# Patient Record
Sex: Male | Born: 1980 | Race: White | Hispanic: No | Marital: Married | Smoking: Never smoker
Health system: Southern US, Community
[De-identification: ages and names within clinical notes are randomized; demographics above are authoritative.]

## PROBLEM LIST (undated history)

## (undated) DIAGNOSIS — Z8489 Family history of other specified conditions: Secondary | ICD-10-CM

## (undated) DIAGNOSIS — N471 Phimosis: Secondary | ICD-10-CM

## (undated) DIAGNOSIS — J309 Allergic rhinitis, unspecified: Secondary | ICD-10-CM

## (undated) DIAGNOSIS — R1310 Dysphagia, unspecified: Secondary | ICD-10-CM

## (undated) DIAGNOSIS — Z8719 Personal history of other diseases of the digestive system: Secondary | ICD-10-CM

## (undated) DIAGNOSIS — K219 Gastro-esophageal reflux disease without esophagitis: Secondary | ICD-10-CM

## (undated) DIAGNOSIS — I10 Essential (primary) hypertension: Secondary | ICD-10-CM

## (undated) HISTORY — DX: Dysphagia, unspecified: R13.10

## (undated) HISTORY — DX: Allergic rhinitis, unspecified: J30.9

## (undated) HISTORY — DX: Gastro-esophageal reflux disease without esophagitis: K21.9

---
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
## 2002-07-26 ENCOUNTER — Emergency Department (HOSPITAL_COMMUNITY): Admission: EM | Admit: 2002-07-26 | Discharge: 2002-07-26 | Payer: Self-pay | Admitting: *Deleted

## 2003-08-06 ENCOUNTER — Ambulatory Visit (HOSPITAL_COMMUNITY): Admission: RE | Admit: 2003-08-06 | Discharge: 2003-08-06 | Payer: Self-pay | Admitting: Family Medicine

## 2003-08-16 ENCOUNTER — Ambulatory Visit (HOSPITAL_COMMUNITY): Admission: RE | Admit: 2003-08-16 | Discharge: 2003-08-16 | Payer: Self-pay | Admitting: Family Medicine

## 2004-09-03 ENCOUNTER — Emergency Department (HOSPITAL_COMMUNITY): Admission: EM | Admit: 2004-09-03 | Discharge: 2004-09-03 | Payer: Self-pay | Admitting: Emergency Medicine

## 2010-05-16 ENCOUNTER — Other Ambulatory Visit (HOSPITAL_COMMUNITY): Payer: Self-pay | Admitting: Family Medicine

## 2010-05-16 DIAGNOSIS — R1012 Left upper quadrant pain: Secondary | ICD-10-CM

## 2010-05-18 ENCOUNTER — Ambulatory Visit (HOSPITAL_COMMUNITY)
Admission: RE | Admit: 2010-05-18 | Discharge: 2010-05-18 | Disposition: A | Discharge status: Home / Self Care | Payer: BC Managed Care – PPO | Source: Ambulatory Visit | Attending: Family Medicine | Admitting: Family Medicine

## 2010-05-18 DIAGNOSIS — R935 Abnormal findings on diagnostic imaging of other abdominal regions, including retroperitoneum: Secondary | ICD-10-CM | POA: Insufficient documentation

## 2010-05-18 DIAGNOSIS — R1012 Left upper quadrant pain: Secondary | ICD-10-CM | POA: Insufficient documentation

## 2010-05-23 ENCOUNTER — Other Ambulatory Visit (HOSPITAL_COMMUNITY): Payer: Self-pay | Admitting: Family Medicine

## 2010-05-23 DIAGNOSIS — Z09 Encounter for follow-up examination after completed treatment for conditions other than malignant neoplasm: Secondary | ICD-10-CM

## 2010-05-23 DIAGNOSIS — R935 Abnormal findings on diagnostic imaging of other abdominal regions, including retroperitoneum: Secondary | ICD-10-CM

## 2010-05-26 ENCOUNTER — Other Ambulatory Visit (HOSPITAL_COMMUNITY): Payer: BC Managed Care – PPO

## 2010-06-05 ENCOUNTER — Ambulatory Visit (HOSPITAL_COMMUNITY)
Admission: RE | Admit: 2010-06-05 | Discharge: 2010-06-05 | Disposition: A | Discharge status: Home / Self Care | Payer: BC Managed Care – PPO | Source: Ambulatory Visit | Attending: Family Medicine | Admitting: Family Medicine

## 2010-06-05 DIAGNOSIS — R935 Abnormal findings on diagnostic imaging of other abdominal regions, including retroperitoneum: Secondary | ICD-10-CM | POA: Insufficient documentation

## 2010-06-05 DIAGNOSIS — Z09 Encounter for follow-up examination after completed treatment for conditions other than malignant neoplasm: Secondary | ICD-10-CM

## 2010-06-05 DIAGNOSIS — R1012 Left upper quadrant pain: Secondary | ICD-10-CM | POA: Insufficient documentation

## 2010-06-05 DIAGNOSIS — K7689 Other specified diseases of liver: Secondary | ICD-10-CM | POA: Insufficient documentation

## 2010-06-05 LAB — CREATININE, SERUM
Creatinine, Ser: 0.89 mg/dL (ref 0.4–1.5)
GFR calc Af Amer: >60 mL/min (ref >60)

## 2010-06-05 MED ORDER — GADOBENATE DIMEGLUMINE 529 MG/ML IV SOLN: 20.0000 mL | Freq: Once | INTRAVENOUS | Status: AC | PRN

## 2012-01-10 ENCOUNTER — Ambulatory Visit (HOSPITAL_COMMUNITY)
Admission: RE | Admit: 2012-01-10 | Discharge: 2012-01-10 | Disposition: A | Discharge status: Home / Self Care | Payer: BC Managed Care – PPO | Source: Ambulatory Visit | Attending: Family Medicine | Admitting: Family Medicine

## 2012-01-10 ENCOUNTER — Other Ambulatory Visit (HOSPITAL_COMMUNITY): Payer: Self-pay | Admitting: Family Medicine

## 2012-01-10 DIAGNOSIS — M25512 Pain in left shoulder: Secondary | ICD-10-CM

## 2012-01-10 DIAGNOSIS — M25519 Pain in unspecified shoulder: Secondary | ICD-10-CM | POA: Insufficient documentation

## 2012-02-27 DIAGNOSIS — Z8719 Personal history of other diseases of the digestive system: Secondary | ICD-10-CM

## 2012-02-27 DIAGNOSIS — K219 Gastro-esophageal reflux disease without esophagitis: Secondary | ICD-10-CM

## 2012-02-27 HISTORY — DX: Personal history of other diseases of the digestive system: Z87.19

## 2012-02-27 HISTORY — DX: Gastro-esophageal reflux disease without esophagitis: K21.9

## 2012-05-11 ENCOUNTER — Encounter (HOSPITAL_COMMUNITY): Payer: Self-pay

## 2012-05-11 ENCOUNTER — Emergency Department (HOSPITAL_COMMUNITY)
Admission: EM | Admit: 2012-05-11 | Discharge: 2012-05-11 | Disposition: A | Discharge status: Home / Self Care | Payer: BC Managed Care – PPO | Attending: Emergency Medicine | Admitting: Emergency Medicine

## 2012-05-11 DIAGNOSIS — I10 Essential (primary) hypertension: Secondary | ICD-10-CM | POA: Insufficient documentation

## 2012-05-11 DIAGNOSIS — R109 Unspecified abdominal pain: Secondary | ICD-10-CM | POA: Insufficient documentation

## 2012-05-11 DIAGNOSIS — Z8719 Personal history of other diseases of the digestive system: Secondary | ICD-10-CM | POA: Insufficient documentation

## 2012-05-11 DIAGNOSIS — R112 Nausea with vomiting, unspecified: Secondary | ICD-10-CM | POA: Insufficient documentation

## 2012-05-11 DIAGNOSIS — R197 Diarrhea, unspecified: Secondary | ICD-10-CM | POA: Insufficient documentation

## 2012-05-11 LAB — CBC WITH DIFFERENTIAL/PLATELET
Basophils Absolute: 0 10*3/uL (ref 0.0–0.1)
Basophils Relative: 0 % (ref 0–1)
Eosinophils Absolute: 0 10*3/uL (ref 0.0–0.7)
Eosinophils Relative: 0 % (ref 0–5)
HCT: 45 % (ref 39.0–52.0)
Hemoglobin: 16.5 g/dL (ref 13.0–17.0)
Lymphocytes Relative: 3 % — ABNORMAL LOW (ref 12–46)
Lymphs Abs: 0.3 10*3/uL — ABNORMAL LOW (ref 0.7–4.0)
MCH: 31.3 pg (ref 26.0–34.0)
MCHC: 36.7 g/dL — ABNORMAL HIGH (ref 30.0–36.0)
MCV: 85.4 fL (ref 78.0–100.0)
Monocytes Absolute: 0.6 10*3/uL (ref 0.1–1.0)
Monocytes Relative: 7 % (ref 3–12)
Neutro Abs: 7.7 10*3/uL (ref 1.7–7.7)
Neutrophils Relative %: 90 % — ABNORMAL HIGH (ref 43–77)
Platelets: 168 10*3/uL (ref 150–400)
RBC: 5.27 MIL/uL (ref 4.22–5.81)
RDW: 13.2 % (ref 11.5–15.5)
WBC: 8.5 10*3/uL (ref 4.0–10.5)

## 2012-05-11 LAB — URINALYSIS, ROUTINE W REFLEX MICROSCOPIC
Bilirubin Urine: NEGATIVE
Glucose, UA: NEGATIVE mg/dL
Hgb urine dipstick: NEGATIVE
Ketones, ur: NEGATIVE mg/dL
Leukocytes, UA: NEGATIVE
Nitrite: NEGATIVE
Protein, ur: NEGATIVE mg/dL
Specific Gravity, Urine: 1.015 (ref 1.005–1.030)
Urobilinogen, UA: 0.2 mg/dL (ref 0.0–1.0)
pH: 6 (ref 5.0–8.0)

## 2012-05-11 LAB — COMPREHENSIVE METABOLIC PANEL
AST: 21 U/L (ref 0–37)
Albumin: 4.3 g/dL (ref 3.5–5.2)
BUN: 14 mg/dL (ref 6–23)
CO2: 27 mEq/L (ref 19–32)
Calcium: 9.1 mg/dL (ref 8.4–10.5)
Chloride: 101 mEq/L (ref 96–112)
Glucose, Bld: 136 mg/dL — ABNORMAL HIGH (ref 70–99)
Potassium: 3.8 mEq/L (ref 3.5–5.1)
Total Bilirubin: 0.9 mg/dL (ref 0.3–1.2)
Total Protein: 7.4 g/dL (ref 6.0–8.3)

## 2012-05-11 LAB — LIPASE, BLOOD: Lipase: 13 U/L (ref 11–59)

## 2012-05-11 MED ORDER — SODIUM CHLORIDE 0.9 % IV BOLUS (SEPSIS)
1000.0000 mL | Freq: Once | INTRAVENOUS | Status: AC
  Administered 2012-05-11: 1000 mL via INTRAVENOUS

## 2012-05-11 MED ORDER — ONDANSETRON HCL 4 MG/2ML IJ SOLN
4.0000 mg | Freq: Once | INTRAMUSCULAR | Status: AC
  Administered 2012-05-11: 4 mg via INTRAVENOUS
  Filled 2012-05-11: qty 2

## 2012-05-11 MED ORDER — SODIUM CHLORIDE 0.9 % IV SOLN
Freq: Once | INTRAVENOUS | Status: AC
  Administered 2012-05-11: 15:00:00 via INTRAVENOUS

## 2012-05-11 MED ORDER — ONDANSETRON HCL 4 MG/2ML IJ SOLN
INTRAMUSCULAR | Status: AC
  Administered 2012-05-11: 4 mg via INTRAVENOUS
  Filled 2012-05-11: qty 2

## 2012-05-11 MED ORDER — METOCLOPRAMIDE HCL 10 MG PO TABS: 10.0000 mg | ORAL_TABLET | Freq: Four times a day (QID) | ORAL | Status: DC | PRN

## 2012-05-11 MED ORDER — ONDANSETRON HCL 4 MG/2ML IJ SOLN
4.0000 mg | Freq: Once | INTRAMUSCULAR | Status: AC
  Administered 2012-05-11: 4 mg via INTRAVENOUS

## 2012-05-11 NOTE — ED Notes (Signed)
Pt began with episodes of diarrhea and vomiting last night that have not yet resolved. Not able to keep any food or fluids down.

## 2012-05-11 NOTE — ED Notes (Signed)
Pt with vomiting and diarrhea since last night, unable to keep anything down per pt.

## 2012-05-11 NOTE — ED Provider Notes (Signed)
History    This chart was scribed for Gerhard Munch, MD by Charolett Bumpers, ED Scribe. The patient was seen in room APA14/APA14. Patient's care was started at 1504.   CSN: [id number]  Arrival date & time 05/11/12  1258   First MD Initiated Contact with Patient 05/11/12 1504      Chief Complaint  Patient presents with  . Emesis  . Diarrhea    The history is provided by the patient. No language interpreter was used.   Zachary Cisneros is a 32 y.o. male who presents to the Emergency Department complaining of persistent, moderate episodes of vomiting with associated diarrhea since 11:30 pm last night. He reports he had some mild abdominal discomfort earlier in the night. He last vomited 2 hours ago and had an episode of diarrhea at noon today. He reports he may have had a sick contact with similar symptoms. He reports a h/o HTN and enlarged spleen. He states that his spleen has been tender after vomiting today. He denies any tobacco or alcohol use.   History reviewed. No pertinent past medical history.  History reviewed. No pertinent past surgical history.  No family history on file.  History  Substance Use Topics  . Smoking status: Never Smoker   . Smokeless tobacco: Not on file  . Alcohol Use: No      Review of Systems  Constitutional:       Per HPI, otherwise negative  HENT:       Per HPI, otherwise negative  Respiratory:       Per HPI, otherwise negative  Cardiovascular:       Per HPI, otherwise negative  Gastrointestinal: Positive for nausea, vomiting and diarrhea.  Endocrine:       Negative aside from HPI  Genitourinary:       Neg aside from HPI   Musculoskeletal:       Per HPI, otherwise negative  Skin: Negative.   Neurological: Negative for syncope.    Allergies  Codeine and Sulfa antibiotics  Home Medications  No current outpatient prescriptions on file.  BP 142/102  Temp(Src) 98 F (36.7 C)  Resp 18  Ht 5\' 8"  (1.727 m)  Wt 268 lb (121.564  kg)  BMI 40.76 kg/m2  SpO2 97%  Physical Exam  Nursing note and vitals reviewed. Constitutional: He is oriented to person, place, and time. He appears well-developed. No distress.  HENT:  Head: Normocephalic and atraumatic.  Eyes: Conjunctivae and EOM are normal.  Cardiovascular: Normal rate, regular rhythm and normal heart sounds.   Pulmonary/Chest: Effort normal. No stridor. No respiratory distress.  Abdominal: Soft. He exhibits no distension. There is no tenderness. There is no rebound and no guarding.  Musculoskeletal: He exhibits no edema.  Neurological: He is alert and oriented to person, place, and time.  Skin: Skin is warm and dry.  Psychiatric: He has a normal mood and affect.    ED Course  Procedures (including critical care time)  DIAGNOSTIC STUDIES: Oxygen Saturation is 97% on room air, adequate by my interpretation.    COORDINATION OF CARE:  15:15-Discussed planned course of treatment with the patient including IV fluids, nausea medication, blood work and UA, who is agreeable at this time.   15:30-Medication Orders: Sodium chloride 0.9% bolus 1,000 mL-once; Ondansetron (Zofran) injection 4 mg-once.   Labs Reviewed - No data to display No results found.   No diagnosis found.    MDM   I personally performed the services described in this  documentation, which was scribed in my presence. The recorded information has been reviewed and is accurate.   This generally well-appearing male presents with concern of ongoing nausea, vomiting, diarrhea.  Notably, the patient had no episodes of diarrhea for several hours prior to the valuation, and throughout his emergency department stay and no additional episodes of vomiting or diarrhea.  The patient remained in no distress throughout his emergency department stay.  Labs do not indicate significant lateral abnormalities, and absent any new evidence of distress, his presentation is most consistent with gastroenteritis.   Absent abdominal pain there is low suspicion for early appendicitis or cholecystitis.  The patient was discharged in stable condition with antiemetics, GI followup as needed.  Gerhard Munch, MD 05/11/12 [phone]

## 2012-09-29 ENCOUNTER — Encounter: Payer: Self-pay | Admitting: Internal Medicine

## 2012-10-24 ENCOUNTER — Encounter: Payer: Self-pay | Admitting: Internal Medicine

## 2012-10-30 ENCOUNTER — Encounter: Payer: Self-pay | Admitting: Internal Medicine

## 2012-10-30 ENCOUNTER — Ambulatory Visit (INDEPENDENT_AMBULATORY_CARE_PROVIDER_SITE_OTHER): Payer: BC Managed Care – PPO | Admitting: Internal Medicine

## 2012-10-30 VITALS — BP 120/62 | HR 60 | Ht 68.0 in | Wt 269.2 lb

## 2012-10-30 DIAGNOSIS — K219 Gastro-esophageal reflux disease without esophagitis: Secondary | ICD-10-CM

## 2012-10-30 DIAGNOSIS — R1310 Dysphagia, unspecified: Secondary | ICD-10-CM

## 2012-10-30 DIAGNOSIS — K7689 Other specified diseases of liver: Secondary | ICD-10-CM

## 2012-10-30 DIAGNOSIS — K760 Fatty (change of) liver, not elsewhere classified: Secondary | ICD-10-CM

## 2012-10-30 NOTE — Patient Instructions (Addendum)
You have been scheduled for an endoscopy with propofol. Please follow written instructions given to you at your visit today. If you use inhalers (even only as needed), please bring them with you on the day of your procedure. Your physician has requested that you go to www.startemmi.com and enter the access code given to you at your visit today. This web site gives a general overview about your procedure. However, you should still follow specific instructions given to you by our office regarding your preparation for the procedure.                                                We are excited to introduce MyChart, a new best-in-class service that provides you online access to important information in your electronic medical record. We want to make it easier for you to view your health information - all in one secure location - when and where you need it. We expect MyChart will enhance the quality of care and service we provide.  When you register for MyChart, you can:    View your test results.    Request appointments and receive appointment reminders via email.    Request medication renewals.    View your medical history, allergies, medications and immunizations.    Communicate with your physician's office through a password-protected site.    Conveniently print information such as your medication lists.  To find out if MyChart is right for you, please talk to a member of our clinical staff today. We will gladly answer your questions about this free health and wellness tool.  If you are age 33 or older and want a member of your family to have access to your record, you must provide written consent by completing a proxy form available at our office. Please speak to our clinical staff about guidelines regarding accounts for patients younger than age 18.  As you activate your MyChart account and need any technical assistance, please call the MyChart technical support line at [phone]  ([phone]) or email your question to [email or web address]. If you email your question(s), please include your name, a return phone number and the best time to reach you.  If you have non-urgent health-related questions, you can send a message to our office through MyChart at [email or web address]. If you have a medical emergency, call 911.  Thank you for using MyChart as your new health and wellness resource!   MyChart licensed from [employer],  1999-2010. Patents Pending.   

## 2012-10-30 NOTE — Progress Notes (Signed)
Patient ID: Zachary Cisneros, male   DOB: 02-18-1981, 32 y.o.   MRN: [id number] HPI: Zachary Cisneros is a 32 yo male with PMH of GERD and allergic rhinitis he was seen in consultation at the request of Dr. Phillips Odor to evaluate dysphagia and reflux. The patient is alone today. He reports somewhat long-standing heartburn and acid reflux symptoms. He's been able to identify that onions definitely trigger his reflux and he avoids them entirely. Over the last 6 or so weeks he has developed some mild dysphagia to solid foods. This is usually with foods such as meats or breads. Liquids seem to go down okay. He occasionally feels a pressure in his anterior neck just above the sternal notch. This is only with eating. Occasionally he does feel like he has to force solids down with liquids. There is no history of food impaction. He denies nausea or vomiting. He denies abdominal pain. Good appetite. No early satiety. Normal bowel movements without diarrhea, constipation, rectal bleeding or melena. He has used omeprazole in the past and has cycled on and off this medicine. He reports when he uses it he does so for approximately one to 2 weeks. He does occasionally indoors regurgitation of solid foods and this can happen in the morning when he has not eaten.  No family history of upper GI tract malignancy. His paternal grandmother had rectal cancer but in her 49s.  No prior history of endoscopy  Past Medical History  Diagnosis Date  . GERD (gastroesophageal reflux disease)   . Dysphagia   . Allergic rhinitis     History reviewed. No pertinent past surgical history.  Current Outpatient Prescriptions  Medication Sig Dispense Refill  . ascorbic acid (VITAMIN C) 250 MG CHEW Chew 250 mg by mouth daily.      . fluticasone (FLONASE) 50 MCG/ACT nasal spray Place 2 sprays into the nose daily.      . moexipril-hydrochlorothiazide (UNIRETIC) 15-25 MG per tablet Take 2 tablets by mouth daily.      Marland Kitchen omeprazole (PRILOSEC) 40 MG capsule  Take 40 mg by mouth daily.       No current facility-administered medications for this visit.    Allergies  Allergen Reactions  . Codeine   . Sulfa Antibiotics     Family History  Problem Relation Age of Onset  . Hypertension Mother   . Hypertension Father   . Lung disease Father     History  Substance Use Topics  . Smoking status: Never Smoker   . Smokeless tobacco: Never Used  . Alcohol Use: No    ROS: As per history of present illness, otherwise negative  BP 120/62  Pulse 60  Ht 5\' 8"  (1.727 m)  Wt 269 lb 4 oz (122.131 kg)  BMI 40.95 kg/m2 Constitutional: Well-developed and well-nourished. No distress. HEENT: Normocephalic and atraumatic. Oropharynx is clear and moist. No oropharyngeal exudate. Conjunctivae are normal.  No scleral icterus. Neck: Neck supple. Trachea midline. Cardiovascular: Normal rate, regular rhythm and intact distal pulses. No M/R/G Pulmonary/chest: Effort normal and breath sounds normal. No wheezing, rales or rhonchi. Abdominal: Soft, nontender, nondistended. Bowel sounds active throughout. There are no masses palpable. No hepatosplenomegaly. Extremities: no clubbing, cyanosis, or edema Lymphadenopathy: No cervical adenopathy noted. Neurological: Alert and oriented to person place and time. Skin: Skin is warm and dry. No rashes noted. Psychiatric: Normal mood and affect. Behavior is normal.  RELEVANT LABS AND IMAGING: CBC    Component Value Date/Time   WBC 8.5 05/11/2012 1611  RBC 5.27 05/11/2012 1611   HGB 16.5 05/11/2012 1611   HCT 45.0 05/11/2012 1611   PLT 168 05/11/2012 1611   MCV 85.4 05/11/2012 1611   MCH 31.3 05/11/2012 1611   MCHC 36.7* 05/11/2012 1611   RDW 13.2 05/11/2012 1611   LYMPHSABS 0.3* 05/11/2012 1611   MONOABS 0.6 05/11/2012 1611   EOSABS 0.0 05/11/2012 1611   BASOSABS 0.0 05/11/2012 1611    CMP     Component Value Date/Time   NA 140 05/11/2012 1611   K 3.8 05/11/2012 1611   CL 101 05/11/2012 1611   CO2 27 05/11/2012  1611   GLUCOSE 136* 05/11/2012 1611   BUN 14 05/11/2012 1611   CREATININE 0.90 05/11/2012 1611   CALCIUM 9.1 05/11/2012 1611   PROT 7.4 05/11/2012 1611   ALBUMIN 4.3 05/11/2012 1611   AST 21 05/11/2012 1611   ALT 39 05/11/2012 1611   ALKPHOS 71 05/11/2012 1611   BILITOT 0.9 05/11/2012 1611   GFRNONAA >90 05/11/2012 1611   GFRAA >90 05/11/2012 1611   MRI ABDOMEN WITH AND WITHOUT CONTRAST - 06/11/2012   Technique:  Multiplanar multisequence MR imaging of the abdomen was performed both before and after administration of intravenous contrast.   Contrast: 20 ml Multihance   Comparison: Ultrasound of 05/18/2010   Findings: Normal heart size without pericardial or pleural effusion.   The liver demonstrates markedly heterogeneous steatosis.  This is most apparent on out of phase series 5, example image 15.  No evidence of cirrhosis.  No focal liver lesion.  Patent portal veins and hepatic veins.   The spleen is enlarged, measuring 12.3 cm in greatest cranial caudal dimension and 7.2 x 13.9 cm transverse.  Normal splenic signal without splenic lesion or infiltrative process.   Normal stomach, pancreas, gallbladder, biliary tract, adrenal glands, and kidneys.  No abdominal adenopathy or ascites.   IMPRESSION:   1.  Markedly heterogeneous hepatic steatosis accounts for the ultrasound abnormality.  No evidence of cirrhosis or focal liver lesion. 2.  Splenomegaly without infiltrative process.    ASSESSMENT/PLAN: 32 yo male with PMH of GERD and allergic rhinitis he was seen in consultation at the request of Dr. Phillips Odor to evaluate dysphagia and reflux.  1.  GERD with mild solid food dysphagia -- the patient meets criteria for gastroesophageal reflux disease, and given his mild solid food dysphagia I have recommended upper endoscopy for direct visualization. This will help exclude ongoing reflux esophagitis or other inflammatory condition such as eosinophilic esophagitis. My suspicion for  obstructive lesion such as a ring, web, or mass is very low.  He is currently not taking PPI, and we'll hold off until after the endoscopy. I have instructed him that he can resume omeprazole 40 mg daily if his reflux symptoms or dysphagia symptoms worsen. He voices understanding. I have also recommended famotidine 20 mg or ranitidine 150 mg every 12 hours on an as-needed basis for more rare symptoms than would be necessary for daily PPI therapy.  EGD was discussed including risks and benefits and he is agreeable to proceed.  2.  Fatty liver -- we discussed fatty liver disease today. Liver enzymes have not been elevated, which overall is a good prognostic sign. I have recommended annual liver enzymes, and if they do become elevated, then this would raise his risk of complications of fatty liver disease such as scarring over time.  Diet and exercise, weight reduction are the standard for improvement in fatty liver disease.  3.  Splenomegaly --  unclear etiology, no evidence for cirrhosis or portal hypertension by imaging. Patient reports he has been told in the past this was sequela of prior infectious mononucleosis.  Observation for now

## 2012-11-06 ENCOUNTER — Ambulatory Visit (AMBULATORY_SURGERY_CENTER): Payer: BC Managed Care – PPO | Admitting: Internal Medicine

## 2012-11-06 ENCOUNTER — Encounter: Payer: Self-pay | Admitting: Internal Medicine

## 2012-11-06 VITALS — BP 115/63 | HR 82 | Temp 97.5°F | Resp 20 | Ht 68.0 in | Wt 269.0 lb

## 2012-11-06 DIAGNOSIS — K2960 Other gastritis without bleeding: Secondary | ICD-10-CM

## 2012-11-06 DIAGNOSIS — R1310 Dysphagia, unspecified: Secondary | ICD-10-CM

## 2012-11-06 DIAGNOSIS — D131 Benign neoplasm of stomach: Secondary | ICD-10-CM

## 2012-11-06 DIAGNOSIS — K219 Gastro-esophageal reflux disease without esophagitis: Secondary | ICD-10-CM

## 2012-11-06 MED ORDER — SODIUM CHLORIDE 0.9 % IV SOLN: 500.0000 mL | INTRAVENOUS | Status: DC

## 2012-11-06 NOTE — Patient Instructions (Addendum)

## 2012-11-06 NOTE — Progress Notes (Signed)
Patient did not experience any of the following events: a burn prior to discharge; a fall within the facility; wrong site/side/patient/procedure/implant event; or a hospital transfer or hospital admission upon discharge from the facility. (G8907) Patient did not have preoperative order for IV antibiotic SSI prophylaxis. (G8918)  

## 2012-11-06 NOTE — Op Note (Signed)
[facility] [address] [place], [address]   ENDOSCOPY PROCEDURE REPORT  PATIENT: Zachary Cisneros, Zachary Cisneros.  MR#: [id number] BIRTHDATE: 1980-03-13 , 32  yrs. old GENDER: Male ENDOSCOPIST: Beverley Fiedler, MD REFERRED BY:  Assunta Found, M.D. PROCEDURE DATE:  11/06/2012 PROCEDURE:  EGD w/ biopsy ASA CLASS:     Class II INDICATIONS:  Dysphagia.   Heartburn. MEDICATIONS: MAC sedation, administered by CRNA and propofol (Diprivan) 250mg  IV TOPICAL ANESTHETIC: Cetacaine Spray  DESCRIPTION OF PROCEDURE: After the risks benefits and alternatives of the procedure were thoroughly explained, informed consent was obtained.  The LB [id number] endoscope was introduced through the mouth and advanced to the second portion of the duodenum. Without limitations.  The instrument was slowly withdrawn as the mucosa was fully examined.   ESOPHAGUS: A normal Z-line was observed 42 cm from the incisors. There was one small circular patch of salmon-colored mucosa 2 cm above the Z line, 40 cm from the incisors. A single biopsy was performed at this patch to exclude Barrett's esophagus. The mucosa of the esophagus appeared otherwise normal.  Multiple biopsies were taken in the distal and mid esophagus to rule out eosinophilic esophagitis.   A small, 2 cm, hiatal hernia was noted.  STOMACH: The mucosa of the stomach appeared normal.  Biopsies were taken in the antrum and angularis.  DUODENUM: The duodenal mucosa showed no abnormalities in the bulb and second portion of the duodenum. Retroflexed views revealed a hiatal hernia.     The scope was then withdrawn from the patient and the procedure completed.  COMPLICATIONS: There were no complications. ENDOSCOPIC IMPRESSION: 1.   Normal Z-line was observed 42 cm from the incisors, small patch of salmon-colored mucosa at 40 cm; biopsied 2.   The mucosa of the esophagus appeared normal; multiple biopsies were taken in the distal and mid  esophagus to rule out eosinophilic esophagitis 3.   Small hiatal hernia 4.   The mucosa of the stomach appeared normal; biopsies were taken in the antrum and angularis 5.   The duodenal mucosa showed no abnormalities in the bulb and second portion of the duodenum  RECOMMENDATIONS: 1.  Await biopsy results 2.  Resume taking your PPI (omeprazole) once daily.  It is best to be taken 20-30 minutes prior to breakfast meal.  eSigned:  Beverley Fiedler, MD 11/06/2012 8:51 AM         CC:The Patient and Assunta Found, MD

## 2012-11-06 NOTE — Progress Notes (Signed)
Called to room to assist during endoscopic procedure.  Patient ID and intended procedure confirmed with present staff. Received instructions for my participation in the procedure from the performing physician.  

## 2012-11-07 ENCOUNTER — Telehealth: Payer: Self-pay | Admitting: *Deleted

## 2012-11-07 NOTE — Telephone Encounter (Signed)
  Follow up Call-  Call back number 11/06/2012  Post procedure Call Back phone  # [phone]  Permission to leave phone message Yes     Patient questions:  Do you have a fever, pain , or abdominal swelling? no Pain Score  0 *  Have you tolerated food without any problems? yes  Have you been able to return to your normal activities? yes  Do you have any questions about your discharge instructions: Diet   no Medications  no Follow up visit  no  Do you have questions or concerns about your Care? no  Actions: * If pain score is 4 or above: No action needed, pain <4.

## 2012-11-12 ENCOUNTER — Encounter: Payer: Self-pay | Admitting: Internal Medicine

## 2014-09-01 IMAGING — CR DG SHOULDER 2+V*L*
3 series · 3 of 3 positions shown · non-contrast
Comparison: None.

CLINICAL DATA: Pain

LEFT SHOULDER - 2+ VIEW

[view not recorded (1 of 3)]
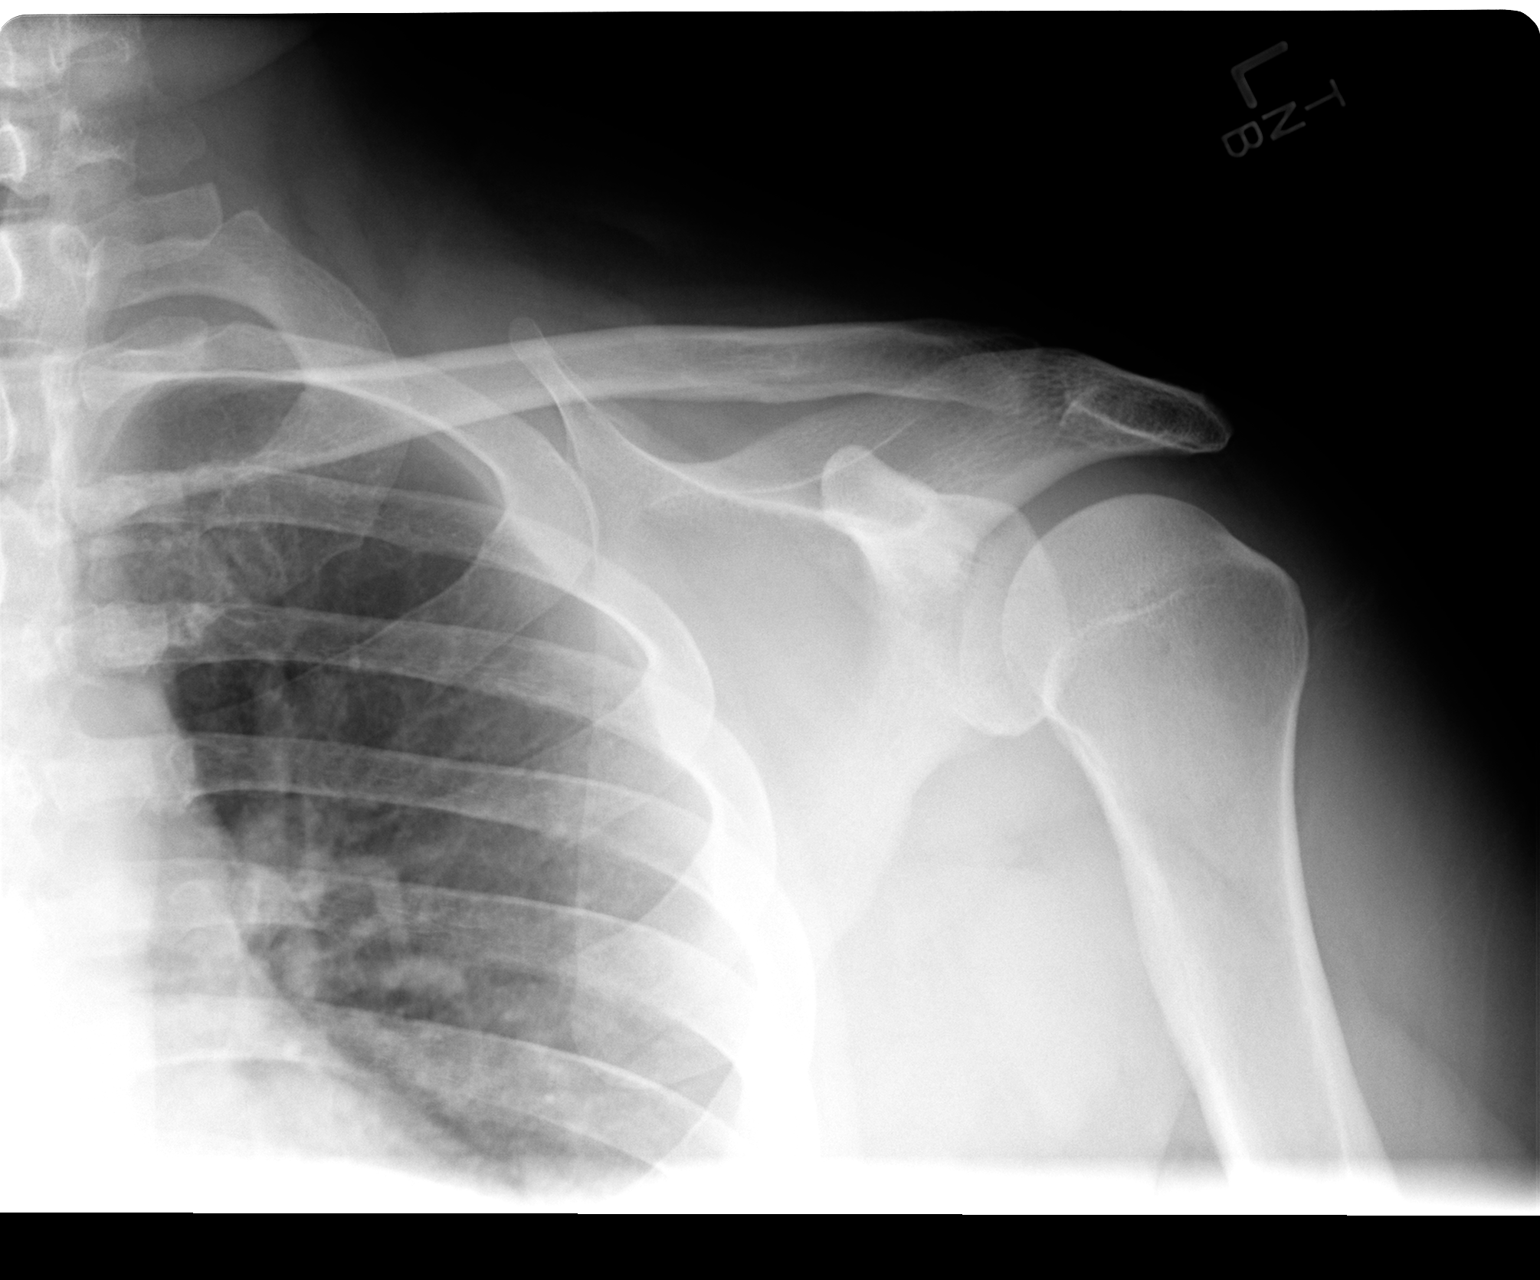

[view not recorded (2 of 3)]
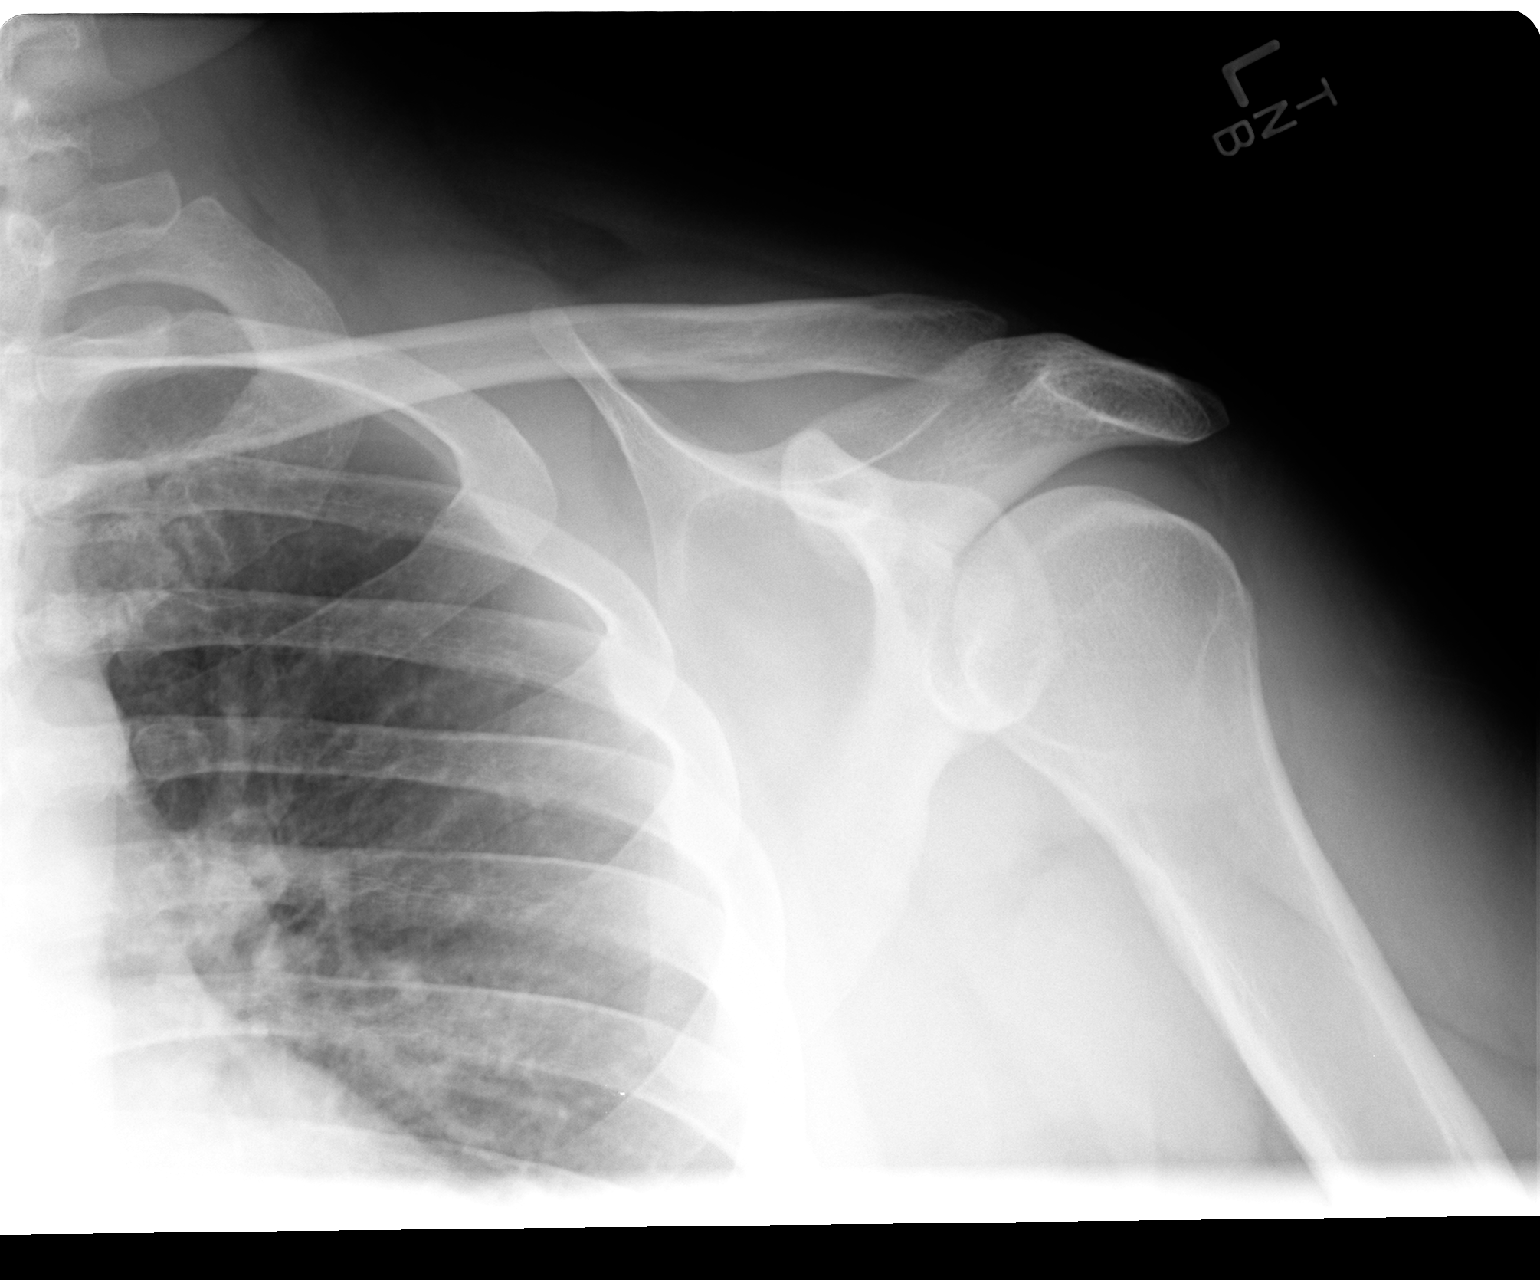

[view not recorded (3 of 3)]
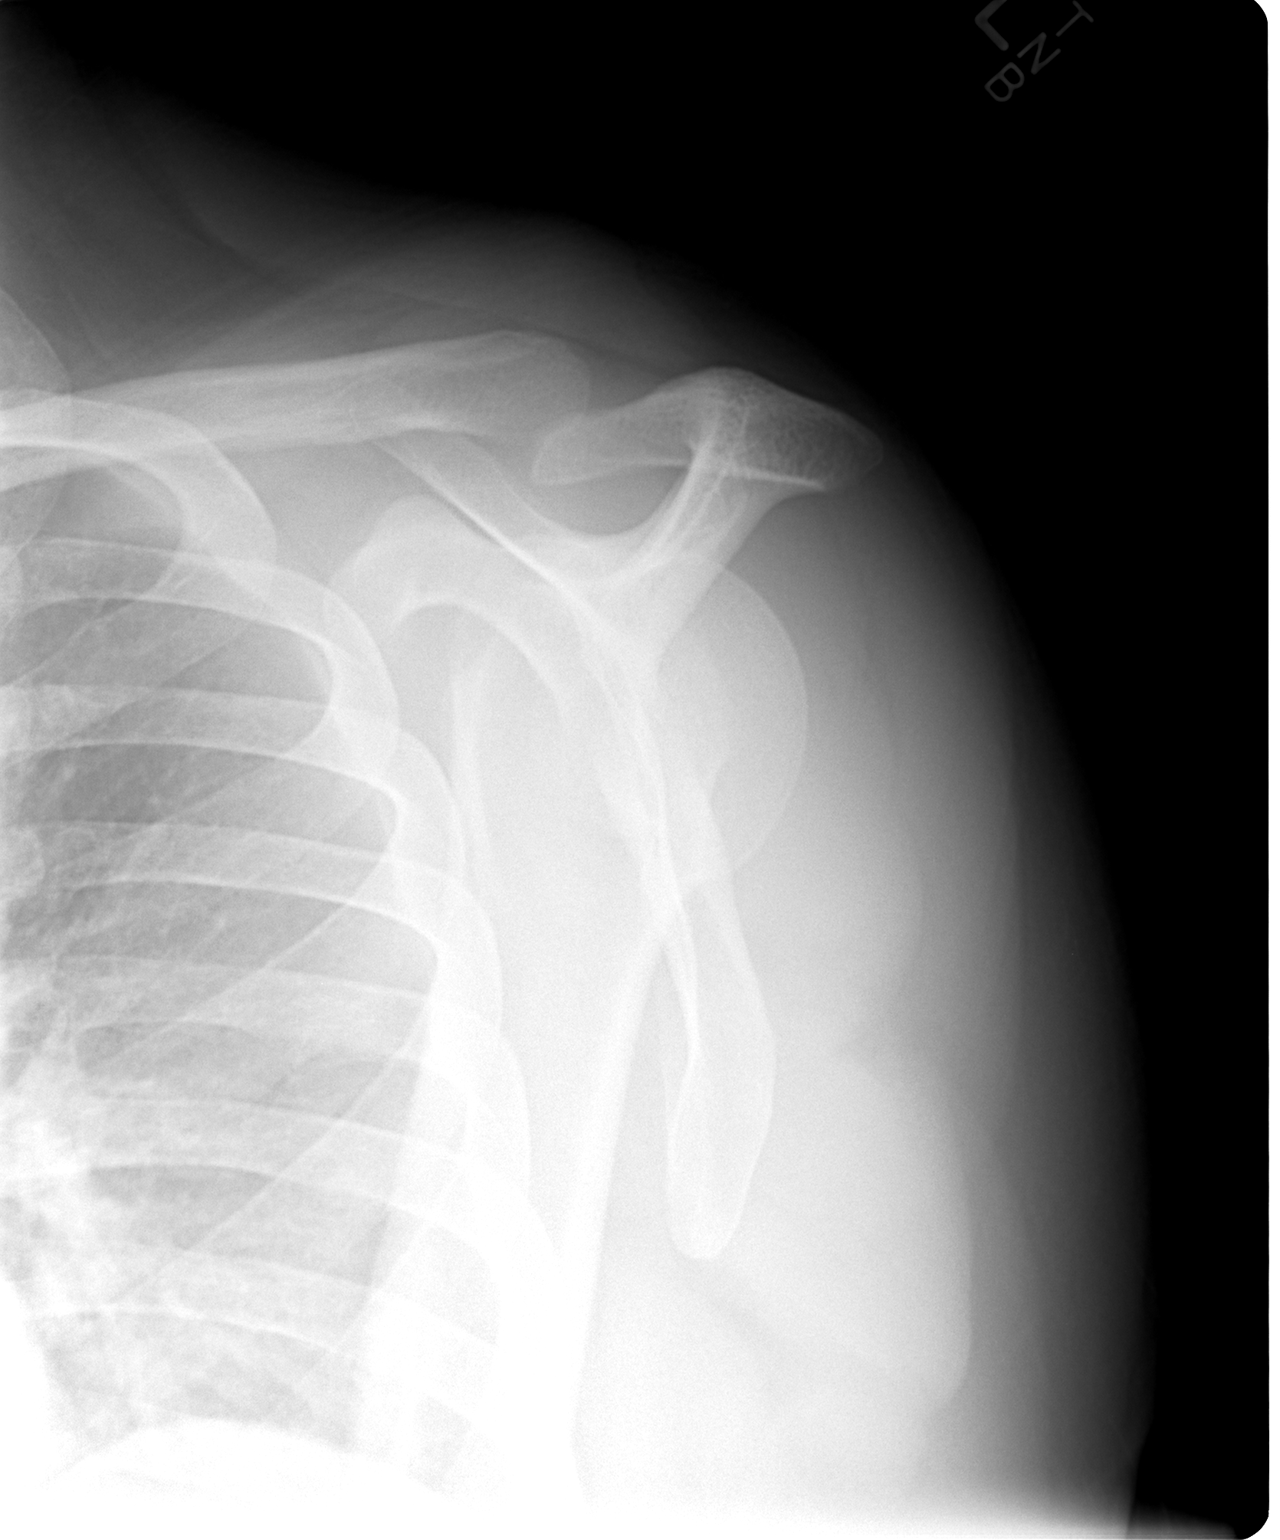

[3 of 3 positions shown; findings below may reference images not displayed]

FINDINGS: Internal rotation, external rotation, and Y scapular
views were obtained.  No fracture or dislocation.  Joint spaces
appear intact.  No erosive change.
IMPRESSION: No abnormality noted.

## 2015-04-20 ENCOUNTER — Other Ambulatory Visit (HOSPITAL_COMMUNITY): Payer: Self-pay | Admitting: Family Medicine

## 2015-04-20 DIAGNOSIS — R52 Pain, unspecified: Secondary | ICD-10-CM

## 2015-04-22 ENCOUNTER — Ambulatory Visit (HOSPITAL_COMMUNITY)
Admission: RE | Admit: 2015-04-22 | Discharge: 2015-04-22 | Disposition: A | Discharge status: Home / Self Care | Payer: BLUE CROSS/BLUE SHIELD | Source: Ambulatory Visit | Attending: Family Medicine | Admitting: Family Medicine

## 2015-04-22 DIAGNOSIS — Z6839 Body mass index (BMI) 39.0-39.9, adult: Secondary | ICD-10-CM | POA: Insufficient documentation

## 2015-04-22 DIAGNOSIS — R102 Pelvic and perineal pain: Secondary | ICD-10-CM | POA: Insufficient documentation

## 2015-04-22 DIAGNOSIS — R52 Pain, unspecified: Secondary | ICD-10-CM

## 2015-04-22 DIAGNOSIS — N433 Hydrocele, unspecified: Secondary | ICD-10-CM | POA: Diagnosis not present

## 2016-05-02 ENCOUNTER — Emergency Department
Admission: EM | Admit: 2016-05-02 | Discharge: 2016-05-02 | Disposition: A | Discharge status: Home / Self Care | Payer: BLUE CROSS/BLUE SHIELD | Source: Home / Self Care | Attending: Family Medicine | Admitting: Family Medicine

## 2016-05-02 ENCOUNTER — Encounter: Payer: Self-pay | Admitting: Emergency Medicine

## 2016-05-02 DIAGNOSIS — H6691 Otitis media, unspecified, right ear: Secondary | ICD-10-CM

## 2016-05-02 DIAGNOSIS — H6123 Impacted cerumen, bilateral: Secondary | ICD-10-CM | POA: Diagnosis not present

## 2016-05-02 MED ORDER — AMOXICILLIN 875 MG PO TABS: 875.0000 mg | ORAL_TABLET | Freq: Two times a day (BID) | ORAL | 0 refills | Status: DC

## 2016-05-02 MED ORDER — NEOMYCIN-POLYMYXIN-HC 3.5-10000-1 OT SUSP: 4.0000 [drp] | Freq: Three times a day (TID) | OTIC | 0 refills | Status: DC

## 2016-05-02 NOTE — ED Triage Notes (Signed)
Pt c/o bilateral ear fullness and pressure x3 days. Used OTC wax drops.

## 2016-05-02 NOTE — Discharge Instructions (Signed)
May also use antibiotic ear drops in left ear for about two days.

## 2016-05-02 NOTE — ED Provider Notes (Signed)
[facility]    CSN: [id number] Arrival date & time: 05/02/16  0834     History   Chief Complaint Chief Complaint  Patient presents with  . Ear Fullness    HPI DAEVION Cisneros is a 36 y.o. male.   Patient developed fullness in his right ear 3 days ago and began using Debrox drops.  He has been able to remove some cerumen over the past 3 days, but right ear remains clogged, with decreased hearing and pressure sensation.  His left ear now feels clogged also.  No sore throat, nasal congestion, or URI symptoms.  No fevers, chills, and sweats. He reports that he had numerous right ear infections when he was young.   The history is provided by the patient.  Ear Fullness  This is a recurrent problem. The current episode started more than 2 days ago. The problem occurs constantly. The problem has been gradually worsening. Pertinent negatives include no headaches. Nothing aggravates the symptoms. Nothing relieves the symptoms. Treatments tried: Debrox drops and ear lavage. The treatment provided no relief.    Past Medical History:  Diagnosis Date  . Allergic rhinitis   . Dysphagia   . GERD (gastroesophageal reflux disease)     Patient Active Problem List   Diagnosis Date Noted  . GERD (gastroesophageal reflux disease) 10/30/2012  . Fatty liver 10/30/2012    History reviewed. No pertinent surgical history.     Home Medications    Prior to Admission medications   Medication Sig Start Date End Date Taking? Authorizing Provider  amoxicillin (AMOXIL) 875 MG tablet Take 1 tablet (875 mg total) by mouth 2 (two) times daily. 05/02/16   Kandra Nicolas, MD  ascorbic acid (VITAMIN C) 250 MG CHEW Chew 250 mg by mouth daily.    Historical Provider, MD  fluticasone (FLONASE) 50 MCG/ACT nasal spray Place 2 sprays into the nose daily.    Historical Provider, MD  moexipril-hydrochlorothiazide (UNIRETIC) 15-25 MG per tablet Take 2 tablets by mouth daily.    Historical Provider, MD    neomycin-polymyxin-hydrocortisone (CORTISPORIN) 3.5-10000-1 otic suspension Place 4 drops into the right ear 3 (three) times daily. 05/02/16   Kandra Nicolas, MD  omeprazole (PRILOSEC) 40 MG capsule Take 40 mg by mouth daily.    Historical Provider, MD    Family History Family History  Problem Relation Age of Onset  . Hypertension Mother   . Hypertension Father   . Lung disease Father     Social History Social History  Substance Use Topics  . Smoking status: Never Smoker  . Smokeless tobacco: Never Used  . Alcohol use No     Allergies   Codeine and Sulfa antibiotics   Review of Systems Review of Systems  Neurological: Negative for headaches.  All other systems reviewed and are negative.    Physical Exam Triage Vital Signs ED Triage Vitals  Enc Vitals Group     BP 05/02/16 0902 143/84     Pulse Rate 05/02/16 0902 70     Resp --      Temp 05/02/16 0902 97.6 F (36.4 C)     Temp Source 05/02/16 0902 Oral     SpO2 05/02/16 0902 99 %     Weight 05/02/16 0903 235 lb (106.6 kg)     Height --      Head Circumference --      Peak Flow --      Pain Score 05/02/16 0904 0  Pain Loc --      Pain Edu? --      Excl. in [place]? --    No data found.   Updated Vital Signs BP 143/84 (BP Location: Right Arm)   Pulse 70   Temp 97.6 F (36.4 C) (Oral)   Wt 235 lb (106.6 kg)   SpO2 99%   BMI 35.73 kg/m   Visual Acuity Right Eye Distance:   Left Eye Distance:   Bilateral Distance:    Right Eye Near:   Left Eye Near:    Bilateral Near:     Physical Exam Nursing notes and Vital Signs reviewed. Appearance:  Patient appears stated age, and in no acute distress Eyes:  Pupils are equal, round, and reactive to light and accomodation.  Extraocular movement is intact.  Conjunctivae are not inflamed  Ears:  Canals occluded with cerumen bilaterally.  Post lavage, right canal is erythematous and right tympanic membrane is erythematous and scarred.  Left canal normal; right  tympanic membrane normal. Nose:   Normal turbinates.   Pharynx:  Normal Neck:  Supple.      UC Treatments / Results  Labs (all labs ordered are listed, but only abnormal results are displayed) Labs Reviewed - No data to display  EKG  EKG Interpretation None       Radiology No results found.  Procedures Procedures Bilateral ear lavage by nurse.  Medications Ordered in UC Medications - No data to display   Initial Impression / Assessment and Plan / UC Course  I have reviewed the triage vital signs and the nursing notes.  Pertinent labs & imaging results that were available during my care of the patient were reviewed by me and considered in my medical decision making (see chart for details).    Begin amoxicillin 875mg  BID.  Begin Cortisporin Otic solution to right ear.  May also use antibiotic ear drops in left ear for about two days. Followup with Family Doctor if not improved in one week.     Final Clinical Impressions(s) / UC Diagnoses   Final diagnoses:  Bilateral impacted cerumen  Right acute otitis media    New Prescriptions New Prescriptions   AMOXICILLIN (AMOXIL) 875 MG TABLET    Take 1 tablet (875 mg total) by mouth 2 (two) times daily.   NEOMYCIN-POLYMYXIN-HYDROCORTISONE (CORTISPORIN) 3.5-10000-1 OTIC SUSPENSION    Place 4 drops into the right ear 3 (three) times daily.     Kandra Nicolas, MD 05/02/16 1019

## 2016-07-27 DIAGNOSIS — N471 Phimosis: Secondary | ICD-10-CM

## 2016-07-27 HISTORY — DX: Phimosis: N47.1

## 2016-08-13 ENCOUNTER — Other Ambulatory Visit: Payer: Self-pay | Admitting: Urology

## 2016-08-14 ENCOUNTER — Encounter (HOSPITAL_BASED_OUTPATIENT_CLINIC_OR_DEPARTMENT_OTHER): Payer: Self-pay | Admitting: *Deleted

## 2016-08-14 NOTE — Progress Notes (Signed)
To [facility] at 1345-Istat on arrival.Npo after Mn solids,clear liquids(no dairy,pulp)until 0900,then Npo all.

## 2016-08-22 ENCOUNTER — Ambulatory Visit (HOSPITAL_BASED_OUTPATIENT_CLINIC_OR_DEPARTMENT_OTHER): Payer: BLUE CROSS/BLUE SHIELD | Admitting: Anesthesiology

## 2016-08-22 ENCOUNTER — Ambulatory Visit (HOSPITAL_BASED_OUTPATIENT_CLINIC_OR_DEPARTMENT_OTHER)
Admission: RE | Admit: 2016-08-22 | Discharge: 2016-08-22 | Disposition: A | Discharge status: Home / Self Care | Payer: BLUE CROSS/BLUE SHIELD | Source: Ambulatory Visit | Attending: Urology | Admitting: Urology

## 2016-08-22 ENCOUNTER — Encounter (HOSPITAL_BASED_OUTPATIENT_CLINIC_OR_DEPARTMENT_OTHER)
Admission: RE | Disposition: A | Discharge status: Home / Self Care | Payer: Self-pay | Source: Ambulatory Visit | Attending: Urology

## 2016-08-22 ENCOUNTER — Encounter (HOSPITAL_BASED_OUTPATIENT_CLINIC_OR_DEPARTMENT_OTHER): Payer: Self-pay

## 2016-08-22 DIAGNOSIS — N478 Other disorders of prepuce: Secondary | ICD-10-CM | POA: Diagnosis not present

## 2016-08-22 DIAGNOSIS — Z7951 Long term (current) use of inhaled steroids: Secondary | ICD-10-CM | POA: Insufficient documentation

## 2016-08-22 DIAGNOSIS — N471 Phimosis: Secondary | ICD-10-CM | POA: Insufficient documentation

## 2016-08-22 DIAGNOSIS — J309 Allergic rhinitis, unspecified: Secondary | ICD-10-CM | POA: Insufficient documentation

## 2016-08-22 DIAGNOSIS — Z79899 Other long term (current) drug therapy: Secondary | ICD-10-CM | POA: Diagnosis not present

## 2016-08-22 HISTORY — DX: Family history of other specified conditions: Z84.89

## 2016-08-22 HISTORY — PX: CIRCUMCISION: SHX1350

## 2016-08-22 HISTORY — DX: Personal history of other diseases of the digestive system: Z87.19

## 2016-08-22 HISTORY — DX: Phimosis: N47.1

## 2016-08-22 LAB — POCT I-STAT 4, (NA,K, GLUC, HGB,HCT)
GLUCOSE: 106 mg/dL — AB (ref 65–99)
HCT: 43 % (ref 39.0–52.0)
Hemoglobin: 14.6 g/dL (ref 13.0–17.0)
POTASSIUM: 3.5 mmol/L (ref 3.5–5.1)
SODIUM: 140 mmol/L (ref 135–145)

## 2016-08-22 SURGERY — CIRCUMCISION, ADULT
Anesthesia: General | Site: Penis

## 2016-08-22 MED ORDER — DEXAMETHASONE SODIUM PHOSPHATE 10 MG/ML IJ SOLN
INTRAMUSCULAR | Status: AC
  Filled 2016-08-22: qty 1

## 2016-08-22 MED ORDER — BUPIVACAINE HCL 0.25 % IJ SOLN
INTRAMUSCULAR | Status: DC | PRN
  Administered 2016-08-22: 10 mL

## 2016-08-22 MED ORDER — DEXAMETHASONE SODIUM PHOSPHATE 10 MG/ML IJ SOLN
INTRAMUSCULAR | Status: DC | PRN
  Administered 2016-08-22: 10 mg via INTRAVENOUS

## 2016-08-22 MED ORDER — ACETAMINOPHEN 160 MG/5ML PO SOLN
1000.0000 mg | Freq: Once | ORAL | Status: AC
Start: 2016-08-22 — End: 2016-08-22
  Administered 2016-08-22: 1000 mg via ORAL
  Filled 2016-08-22: qty 40.6

## 2016-08-22 MED ORDER — ONDANSETRON HCL 4 MG/2ML IJ SOLN
INTRAMUSCULAR | Status: AC
  Filled 2016-08-22: qty 2

## 2016-08-22 MED ORDER — TRAMADOL HCL 50 MG PO TABS: 50.0000 mg | ORAL_TABLET | Freq: Four times a day (QID) | ORAL | 0 refills | Status: DC | PRN

## 2016-08-22 MED ORDER — PROPOFOL 10 MG/ML IV BOLUS
INTRAVENOUS | Status: DC | PRN
  Administered 2016-08-22: 50 mg via INTRAVENOUS
  Administered 2016-08-22: 250 mg via INTRAVENOUS

## 2016-08-22 MED ORDER — 0.9 % SODIUM CHLORIDE (POUR BTL) OPTIME
TOPICAL | Status: DC | PRN
  Administered 2016-08-22: 500 mL

## 2016-08-22 MED ORDER — MIDAZOLAM HCL 2 MG/2ML IJ SOLN
INTRAMUSCULAR | Status: AC
  Filled 2016-08-22: qty 2

## 2016-08-22 MED ORDER — LIDOCAINE 2% (20 MG/ML) 5 ML SYRINGE
INTRAMUSCULAR | Status: AC
  Filled 2016-08-22: qty 5

## 2016-08-22 MED ORDER — CEFAZOLIN SODIUM-DEXTROSE 2-3 GM-% IV SOLR
INTRAVENOUS | Status: DC | PRN
  Administered 2016-08-22: 2 g via INTRAVENOUS

## 2016-08-22 MED ORDER — LACTATED RINGERS IV SOLN
INTRAVENOUS | Status: DC
  Administered 2016-08-22: 11:00:00 via INTRAVENOUS
  Filled 2016-08-22: qty 1000

## 2016-08-22 MED ORDER — PROPOFOL 10 MG/ML IV BOLUS
INTRAVENOUS | Status: AC
  Filled 2016-08-22: qty 20

## 2016-08-22 MED ORDER — FENTANYL CITRATE (PF) 100 MCG/2ML IJ SOLN
INTRAMUSCULAR | Status: DC | PRN
  Administered 2016-08-22 (×2): 50 ug via INTRAVENOUS

## 2016-08-22 MED ORDER — CEFAZOLIN SODIUM-DEXTROSE 2-4 GM/100ML-% IV SOLN
INTRAVENOUS | Status: AC
  Filled 2016-08-22: qty 100

## 2016-08-22 MED ORDER — ACETAMINOPHEN 160 MG/5ML PO SOLN
ORAL | Status: AC
  Filled 2016-08-22: qty 40.6

## 2016-08-22 MED ORDER — LIDOCAINE 2% (20 MG/ML) 5 ML SYRINGE
INTRAMUSCULAR | Status: DC | PRN
  Administered 2016-08-22: 100 mg via INTRAVENOUS

## 2016-08-22 MED ORDER — SENNOSIDES-DOCUSATE SODIUM 8.6-50 MG PO TABS: 1.0000 | ORAL_TABLET | Freq: Two times a day (BID) | ORAL | 0 refills | Status: DC

## 2016-08-22 MED ORDER — FENTANYL CITRATE (PF) 100 MCG/2ML IJ SOLN
INTRAMUSCULAR | Status: AC
  Filled 2016-08-22: qty 2

## 2016-08-22 MED ORDER — MIDAZOLAM HCL 5 MG/5ML IJ SOLN
INTRAMUSCULAR | Status: DC | PRN
  Administered 2016-08-22: 2 mg via INTRAVENOUS

## 2016-08-22 SURGICAL SUPPLY — 28 items
BANDAGE CO FLEX L/F 1IN X 5YD (GAUZE/BANDAGES/DRESSINGS) ×3 IMPLANT
BLADE SURG 15 STRL LF DISP TIS (BLADE) ×1 IMPLANT
BLADE SURG 15 STRL SS (BLADE) ×3
BNDG CONFORM 2 STRL LF (GAUZE/BANDAGES/DRESSINGS) ×3 IMPLANT
COVER BACK TABLE 60X90IN (DRAPES) ×3 IMPLANT
COVER MAYO STAND STRL (DRAPES) ×3 IMPLANT
DRAPE LAPAROTOMY 100X72 PEDS (DRAPES) ×3 IMPLANT
ELECT NEEDLE TIP 2.8 STRL (NEEDLE) IMPLANT
ELECT REM PT RETURN 9FT ADLT (ELECTROSURGICAL) ×3
ELECTRODE REM PT RTRN 9FT ADLT (ELECTROSURGICAL) ×1 IMPLANT
GAUZE SPONGE 4X4 16PLY XRAY LF (GAUZE/BANDAGES/DRESSINGS) ×6 IMPLANT
GAUZE XEROFORM 1X8 LF (GAUZE/BANDAGES/DRESSINGS) ×3 IMPLANT
GLOVE BIO SURGEON STRL SZ7.5 (GLOVE) ×3 IMPLANT
GLOVE INDICATOR 7.5 STRL GRN (GLOVE) ×6 IMPLANT
GOWN STRL REUS W/ TWL XL LVL3 (GOWN DISPOSABLE) ×1 IMPLANT
GOWN STRL REUS W/TWL XL LVL3 (GOWN DISPOSABLE) ×3
KIT RM TURNOVER CYSTO AR (KITS) ×3 IMPLANT
NEEDLE HYPO 25X1 1.5 SAFETY (NEEDLE) ×3 IMPLANT
NS IRRIG 500ML POUR BTL (IV SOLUTION) ×3 IMPLANT
PACK BASIN DAY SURGERY FS (CUSTOM PROCEDURE TRAY) ×3 IMPLANT
PENCIL BUTTON HOLSTER BLD 10FT (ELECTRODE) ×3 IMPLANT
SPONGE GAUZE 4X4 12PLY (GAUZE/BANDAGES/DRESSINGS) ×3 IMPLANT
SUT VICRYL 4-0 PS2 18IN ABS (SUTURE) ×9 IMPLANT
SYR CONTROL 10ML LL (SYRINGE) ×3 IMPLANT
TOWEL OR 17X26 10 PK STRL BLUE (TOWEL DISPOSABLE) ×3 IMPLANT
TRAY DSU PREP LF (CUSTOM PROCEDURE TRAY) ×3 IMPLANT
TUBE CONNECTING 12'X1/4 (SUCTIONS)
TUBE CONNECTING 12X1/4 (SUCTIONS) IMPLANT

## 2016-08-22 NOTE — Discharge Instructions (Signed)
1 - Refrain from sexual stimulation x 2 weeks. You may shower anytime. Remove dressing tomorrow morning at home. All stitches are dissolvable and will disappear completely over the next month.   2 - Call MD or go to ER for fever >102, severe pain / nausea / vomiting not relieved by medications, or acute change in medical status  Post Anesthesia Home Care Instructions  Activity: Get plenty of rest for the remainder of the day. A responsible individual must stay with you for 24 hours following the procedure.  For the next 24 hours, DO NOT: -Drive a car -Paediatric nurse -Drink alcoholic beverages -Take any medication unless instructed by your physician -Make any legal decisions or sign important papers.  Meals: Start with liquid foods such as gelatin or soup. Progress to regular foods as tolerated. Avoid greasy, spicy, heavy foods. If nausea and/or vomiting occur, drink only clear liquids until the nausea and/or vomiting subsides. Call your physician if vomiting continues.  Special Instructions/Symptoms: Your throat may feel dry or sore from the anesthesia or the breathing tube placed in your throat during surgery. If this causes discomfort, gargle with warm salt water. The discomfort should disappear within 24 hours.  If you had a scopolamine patch placed behind your ear for the management of post- operative nausea and/or vomiting:  1. The medication in the patch is effective for 72 hours, after which it should be removed.  Wrap patch in a tissue and discard in the trash. Wash hands thoroughly with soap and water. 2. You may remove the patch earlier than 72 hours if you experience unpleasant side effects which may include dry mouth, dizziness or visual disturbances. 3. Avoid touching the patch. Wash your hands with soap and water after contact with the patch.

## 2016-08-22 NOTE — Anesthesia Preprocedure Evaluation (Addendum)
Anesthesia Evaluation  Patient identified by MRN, date of birth, ID band Patient awake    Reviewed: Allergy & Precautions, H&P , Patient's Chart, lab work & pertinent test results, reviewed documented beta blocker date and time   Airway Mallampati: II  TM Distance: >3 FB Neck ROM: full    Dental no notable dental hx.    Pulmonary    Pulmonary exam normal breath sounds clear to auscultation       Cardiovascular hypertension, On Medications  Rhythm:regular Rate:Normal     Neuro/Psych    GI/Hepatic   Endo/Other    Renal/GU      Musculoskeletal   Abdominal   Peds  Hematology   Anesthesia Other Findings   Reproductive/Obstetrics                             Anesthesia Physical Anesthesia Plan  ASA: II  Anesthesia Plan: General   Post-op Pain Management:    Induction: Intravenous  PONV Risk Score and Plan:   Airway Management Planned: LMA  Additional Equipment:   Intra-op Plan:   Post-operative Plan:   Informed Consent: I have reviewed the patients History and Physical, chart, labs and discussed the procedure including the risks, benefits and alternatives for the proposed anesthesia with the patient or authorized representative who has indicated his/her understanding and acceptance.   Dental Advisory Given  Plan Discussed with: CRNA and Surgeon  Anesthesia Plan Comments: ( )        Anesthesia Quick Evaluation  

## 2016-08-22 NOTE — Anesthesia Postprocedure Evaluation (Signed)
Anesthesia Post Note  Patient: Zachary Cisneros  Procedure(s) Performed: Procedure(s) (LRB): CIRCUMCISION ADULT (N/A)     Patient location during evaluation: PACU Anesthesia Type: General Level of consciousness: awake and alert Pain management: pain level controlled Vital Signs Assessment: post-procedure vital signs reviewed and stable Respiratory status: spontaneous breathing, nonlabored ventilation, respiratory function stable and patient connected to nasal cannula oxygen Cardiovascular status: blood pressure returned to baseline and stable Postop Assessment: no signs of nausea or vomiting Anesthetic complications: no    Last Vitals:  Vitals:   08/22/16 1315 08/22/16 1415  BP: (!) 141/66 132/72  Pulse: 70 61  Resp: (!) 1 16  Temp:  36.5 C    Last Pain:  Vitals:   08/22/16 1242  TempSrc:   PainSc: Asleep                 Mahealani Sulak EDWARD

## 2016-08-22 NOTE — Transfer of Care (Signed)
Immediate Anesthesia Transfer of Care Note  Patient: Zachary Cisneros  Procedure(s) Performed: Procedure(s): CIRCUMCISION ADULT (N/A)  Patient Location: PACU  Anesthesia Type:General  Level of Consciousness: sedated and responds to stimulation  Airway & Oxygen Therapy: Patient Spontanous Breathing and Patient connected to nasal cannula oxygen  Post-op Assessment: Report given to RN  Post vital signs: Reviewed and stable  Last Vitals: 103/59, 76, 16, 96%, 97.3 Vitals:   08/22/16 1026  BP: 133/84  Pulse: 73  Resp: 18  Temp: 36.6 C    Last Pain:  Vitals:   08/22/16 1026  TempSrc: Oral      Patients Stated Pain Goal: 10 (03/75/43 6067)  Complications: No apparent anesthesia complications

## 2016-08-22 NOTE — H&P (Signed)
Zachary Cisneros is an 36 y.o. male.    Chief Complaint: Pre-op Circumcision  HPI:  1 - Phimosis - pt with progressive bother from phimosis and recurrent balanitis, now very difficult to perform routine hygiene. Uncircumcised.  Today "Zachary Cisneros" is seen to proceed with elective circumcision.   Past Medical History:  Diagnosis Date  . Allergic rhinitis   . Dysphagia   . Family history of adverse reaction to anesthesia    Mother had anesthesia awareness during surgical procedure,apprx.5 yrs ago  . GERD (gastroesophageal reflux disease) 2014   resolved with weight loss  . History of hiatal hernia 2014   small ,noted in endoscopy report  . Phimosis 07/2016    Past Surgical History:  Procedure Laterality Date  . WISDOM TOOTH EXTRACTION  1999    Family History  Problem Relation Age of Onset  . Hypertension Mother   . Hypertension Father   . Lung disease Father    Social History:  reports that he has never smoked. He has never used smokeless tobacco. He reports that he does not drink alcohol or use drugs.  Allergies:  Allergies  Allergen Reactions  . Codeine     hallucinations  . Sulfa Antibiotics Hives    No prescriptions prior to admission.    No results found for this or any previous visit (from the past 48 hour(s)). No results found.  Review of Systems  Constitutional: Negative.  Negative for chills and fever.  HENT: Negative.   Eyes: Negative.   Respiratory: Negative.   Cardiovascular: Negative.   Gastrointestinal: Negative.   Genitourinary: Negative.   Musculoskeletal: Negative.   Skin: Negative.   Neurological: Negative.   Endo/Heme/Allergies: Negative.   Psychiatric/Behavioral: Negative.     Height 5\' 8"  (1.727 m), weight 106.6 kg (235 lb). Physical Exam  Constitutional: He appears well-developed.  HENT:  Head: Normocephalic.  Neck: Normal range of motion.  Cardiovascular: Normal rate.   Respiratory: Effort normal.  GI: Soft.  Genitourinary:   Genitourinary Comments: Phimosis w/o active balanitis  Neurological: He is alert.  Skin: Skin is warm.  Psychiatric: He has a normal mood and affect.     Assessment/Plan  Proceed as planned with elective circumcision. Risks, benefits, alternatives, expected peri-op course discussed previously and reiterated today.   Alexis Frock, MD 08/22/2016, 8:26 AM

## 2016-08-22 NOTE — Anesthesia Procedure Notes (Signed)
Procedure Name: LMA Insertion Date/Time: 08/22/2016 11:49 AM Performed by: Bethena Roys T Pre-anesthesia Checklist: Patient identified, Emergency Drugs available, Suction available and Patient being monitored Patient Re-evaluated:Patient Re-evaluated prior to inductionOxygen Delivery Method: Circle system utilized Preoxygenation: Pre-oxygenation with 100% oxygen Intubation Type: IV induction Ventilation: Mask ventilation without difficulty LMA: LMA inserted LMA Size: 5.0 Number of attempts: 1 Airway Equipment and Method: Bite block Placement Confirmation: positive ETCO2 Dental Injury: Teeth and Oropharynx as per pre-operative assessment

## 2016-08-22 NOTE — Brief Op Note (Signed)
08/22/2016  12:28 PM  PATIENT:  Zachary Cisneros  36 y.o. male  PRE-OPERATIVE DIAGNOSIS:  PHIMOSIS  POST-OPERATIVE DIAGNOSIS:  PHIMOSIS  PROCEDURE:  Procedure(s): CIRCUMCISION ADULT (N/A)  SURGEON:  Surgeon(s) and Role:    * Alexis Frock, MD - Primary  PHYSICIAN ASSISTANT:   ASSISTANTS: none   ANESTHESIA:   local and general  EBL:  Total I/O In: 650 [I.V.:650] Out: 50 [Blood:50]  BLOOD ADMINISTERED:none  DRAINS: none   LOCAL MEDICATIONS USED:  MARCAINE     SPECIMEN:  Source of Specimen:  foreskin  DISPOSITION OF SPECIMEN:  PATHOLOGY  COUNTS:  YES  TOURNIQUET:  * No tourniquets in log *  DICTATION: .Other Dictation: Dictation Number [id number]  PLAN OF CARE: Discharge to home after PACU  PATIENT DISPOSITION:  PACU - hemodynamically stable.   Delay start of Pharmacological VTE agent (>24hrs) due to surgical blood loss or risk of bleeding: not applicable

## 2016-08-22 NOTE — Interval H&P Note (Signed)
History and Physical Interval Note:  08/22/2016 11:29 AM  Zachary Cisneros  has presented today for surgery, with the diagnosis of PHIMOSIS  The various methods of treatment have been discussed with the patient and family. After consideration of risks, benefits and other options for treatment, the patient has consented to  Procedure(s): CIRCUMCISION ADULT (N/A) as a surgical intervention .  The patient's history has been reviewed, patient examined, no change in status, stable for surgery.  I have reviewed the patient's chart and labs.  Questions were answered to the patient's satisfaction.     Zachary Cisneros

## 2016-08-23 ENCOUNTER — Encounter (HOSPITAL_BASED_OUTPATIENT_CLINIC_OR_DEPARTMENT_OTHER): Payer: Self-pay | Admitting: Urology

## 2016-08-23 NOTE — Op Note (Signed)
NAME:  BRODERICK, FONSECA                       ACCOUNT NO.:  MEDICAL RECORD NO.:  [id number]  LOCATION:                                 FACILITY:  PHYSICIAN:  Alexis Frock, MD          DATE OF BIRTH:  DATE OF PROCEDURE: 08/22/2016                               OPERATIVE REPORT   DIAGNOSIS:  Severe phimosis.  PROCEDURES: 1. Circumcision. 2. Penile block.  ESTIMATED BLOOD LOSS:  50 mL.  COMPLICATION:  None.  SPECIMEN:  Phimotic foreskin for permanent pathology.  FINDINGS:  Severe phimosis with very impressive glanular inner-foreskin leaflet adhesions, severe.  INDICATION:  Mr. Trompeter is a pleasant 36 year old gentleman with history of recurrent phimosis and balanitis, who has had progressive problems with maintaining penile hygiene, he was uncircumcised.  Options were discussed for further management including a topical therapy versus dorsal slit versus circumcision.  He adamantly wished to proceed with the latter.  Informed consent was obtained and placed in the medical record.  PROCEDURE IN DETAIL:  The patient being, Kennie Snedden, was verified. Procedure being circumcision was confirmed.  Procedure was carried out. Time-out was performed.  Intravenous antibiotics were administered. General LMA anesthesia was introduced and sterile field was created by prepping the patient's penis and perineum including his inner-outer foreskin leaflet with iodine.  There were severe glanular and foreskin leaflet adhesions and these were very carefully taken down using combination of scissors and spreading with hemostat and further iodine was prepped to the inner-foreskin leaflet.  Next, the outer preputial collar was demarcated at the level of the unstretched corona of the glans and circumferential incision was made.  Proximal incision was made circumferentially just below the area of the frenulum approximately 5 mm proximal to the corona of the glans, and these were connected in the midline  in the redundant preputial foreskin release using cautery, taking exquisite care to avoid ureteral injury, which did not occur. Stay stitch was placed at 12 o'clock position and 6 o'clock position, U- stitch at the frenulum and then each side was reapproximated using running 4-0 Vicryl, which resulted in excellent approximation.  Several additional small figure-of-eight sutures were applied to the area of the frenulum and posteriorly for hemostasis given the severe glanular adhesions, which resulted in excellent hemostasis and cosmesis.  Next, penile block was performed.  A 10 mL of 2% plain Marcaine was instilled in a ring block-type fashion at the base of the penis.  Next, a dressing of Xeroform and Kling and Coban was applied and procedure was terminated.  The patient tolerated the procedure well.  There were no immediate periprocedural complications.  The patient was taken to the postanesthesia care unit in stable condition.          ______________________________ Alexis Frock, MD     TM/MEDQ  D:  08/22/2016  T:  08/22/2016  Job:  [id number]

## 2018-02-24 ENCOUNTER — Emergency Department
Admission: EM | Admit: 2018-02-24 | Discharge: 2018-02-24 | Disposition: A | Discharge status: Home / Self Care | Payer: BLUE CROSS/BLUE SHIELD | Source: Home / Self Care | Attending: Emergency Medicine | Admitting: Emergency Medicine

## 2018-02-24 ENCOUNTER — Encounter: Payer: Self-pay | Admitting: *Deleted

## 2018-02-24 ENCOUNTER — Other Ambulatory Visit: Payer: Self-pay

## 2018-02-24 DIAGNOSIS — J209 Acute bronchitis, unspecified: Secondary | ICD-10-CM

## 2018-02-24 MED ORDER — BENZONATATE 200 MG PO CAPS: ORAL_CAPSULE | ORAL | 0 refills | Status: DC

## 2018-02-24 MED ORDER — AZITHROMYCIN 250 MG PO TABS: ORAL_TABLET | ORAL | 0 refills | Status: DC

## 2018-02-24 NOTE — ED Provider Notes (Signed)
[facility]    CSN: [id number] Arrival date & time: 02/24/18  6433     History   Chief Complaint Chief Complaint  Patient presents with  . Cough    HPI Zachary Cisneros is a 37 y.o. male.   HPI URI HISTORY  Lorik is a 37 y.o. male who complains of 7 days of worsening URI symptoms now with cough productive of yellow sputum.  Cough is hacking and keeps him up at night.   Have been using over-the-counter treatment which helps minimally  No chills/sweats +  Fever  + Mild nasal congestion + Minimal discolored Post-nasal drainage No sinus pain/pressure No sore throat  +  cough No wheezing Positive chest congestion No hemoptysis No shortness of breath No pleuritic pain  No itchy/red eyes No earache  No nausea No vomiting No abdominal pain No diarrhea  No skin rashes +  Fatigue No myalgias No headache   Past Medical History:  Diagnosis Date  . Allergic rhinitis   . Dysphagia   . Family history of adverse reaction to anesthesia    Mother had anesthesia awareness during surgical procedure,apprx.5 yrs ago  . GERD (gastroesophageal reflux disease) 2014   resolved with weight loss  . History of hiatal hernia 2014   small ,noted in endoscopy report  . Phimosis 07/2016    Patient Active Problem List   Diagnosis Date Noted  . GERD (gastroesophageal reflux disease) 10/30/2012  . Fatty liver 10/30/2012    Past Surgical History:  Procedure Laterality Date  . CIRCUMCISION N/A 08/22/2016   Procedure: CIRCUMCISION ADULT;  Surgeon: Alexis Frock, MD;  Location: [facility];  Service: Urology;  Laterality: N/A;  . [place] Medications    Prior to Admission medications   Medication Sig Start Date End Date Taking? Authorizing Provider  ascorbic acid (VITAMIN C) 250 MG CHEW Chew 250 mg by mouth daily.   Yes [provider]  losartan (COZAAR) 25 MG tablet Take 25 mg by mouth daily.   Yes  [provider]  azithromycin (ZITHROMAX Z-PAK) 250 MG tablet Take 2 tablets on day one, then 1 tablet daily on days 2 through 5 02/24/18   Jacqulyn Cane, MD  benzonatate (TESSALON) 200 MG capsule Take 1 every 8 hours as needed for cough. 02/24/18   Jacqulyn Cane, MD    Family History Family History  Problem Relation Age of Onset  . Hypertension Mother   . Hypertension Father   . Lung disease Father     Social History Social History   Tobacco Use  . Smoking status: Never Smoker  . Smokeless tobacco: Never Used  Substance Use Topics  . Alcohol use: No  . Drug use: No     Allergies   Codeine and Sulfa antibiotics   Review of Systems Review of Systems  All other systems reviewed and are negative.  Pertinent items noted in HPI and remainder of comprehensive ROS otherwise negative.   Physical Exam Triage Vital Signs ED Triage Vitals  Enc Vitals Group     BP 02/24/18 0910 (!) 142/86     Pulse Rate 02/24/18 0910 73     Resp 02/24/18 0910 18     Temp 02/24/18 0910 97.8 F (36.6 C)     Temp Source 02/24/18 0910 Oral     SpO2 02/24/18 0910 98 %     Weight 02/24/18 0911 268 lb (121.6 kg)  Height 02/24/18 0911 5\' 8"  (1.727 m)     Head Circumference --      Peak Flow --      Pain Score 02/24/18 0911 0     Pain Loc --      Pain Edu? --      Excl. in [place]? --    No data found.  Updated Vital Signs BP (!) 142/86 (BP Location: Right Arm)   Pulse 73   Temp 97.8 F (36.6 C) (Oral)   Resp 18   Ht 5\' 8"  (1.727 m)   Wt 121.6 kg   SpO2 98%   BMI 40.75 kg/m   Visual Acuity Right Eye Distance:   Left Eye Distance:   Bilateral Distance:    Right Eye Near:   Left Eye Near:    Bilateral Near:     Physical Exam Vitals signs and nursing note reviewed.  Constitutional:      General: He is not in acute distress.    Appearance: He is well-developed.  HENT:     Head: Normocephalic and atraumatic.     Right Ear: Tympanic membrane normal.     Left Ear:  Tympanic membrane normal.     Nose: Nose normal.     Mouth/Throat:     Pharynx: No oropharyngeal exudate.  Eyes:     General: No scleral icterus.       Right eye: No discharge.        Left eye: No discharge.  Neck:     Musculoskeletal: Neck supple.  Cardiovascular:     Rate and Rhythm: Normal rate and regular rhythm.     Heart sounds: Normal heart sounds.  Pulmonary:     Effort: No respiratory distress.     Breath sounds: Rhonchi present. No wheezing or rales.  Lymphadenopathy:     Cervical: No cervical adenopathy.  Skin:    General: Skin is warm and dry.  Neurological:     Mental Status: He is alert and oriented to person, place, and time.    Occasional hacking cough noted.  UC Treatments / Results  Labs (all labs ordered are listed, but only abnormal results are displayed) Labs Reviewed - No data to display  EKG None  Radiology No results found.  Procedures Procedures (including critical care time)  Medications Ordered in UC Medications - No data to display  Initial Impression / Assessment and Plan / UC Course  I have reviewed the triage vital signs and the nursing notes.  Pertinent labs & imaging results that were available during my care of the patient were reviewed by me and considered in my medical decision making (see chart for details).      Final Clinical Impressions(s) / UC Diagnoses   Final diagnoses:  Acute bronchitis, unspecified organism   Treatment options discussed, as well as risks, benefits, alternatives. Patient voiced understanding and agreement with the following plans:  ED Prescriptions    Medication Sig Dispense Auth. Provider   azithromycin (ZITHROMAX Z-PAK) 250 MG tablet Take 2 tablets on day one, then 1 tablet daily on days 2 through 5 1 each Jacqulyn Cane, MD   benzonatate (TESSALON) 200 MG capsule Take 1 every 8 hours as needed for cough. 20 capsule Jacqulyn Cane, MD     He declined printing AVS. Other symptomatic care  discussed. Verbal instructions given, questions invited and answered. Follow-up with your primary care doctor in 5-7 days if not improving, or sooner if symptoms become worse. Precautions discussed. Red flags  discussed. Patient voiced understanding and agreement.    Jacqulyn Cane, MD 02/24/18 [phone]

## 2018-02-24 NOTE — ED Triage Notes (Signed)
Pt c/o URI s/s x 02/19/18; worse x 02/21/18 with productive cough. Hx of bronchitis. He has taken Delsym and coricidin. Denies fever.

## 2018-11-03 ENCOUNTER — Other Ambulatory Visit: Payer: Self-pay

## 2018-11-03 ENCOUNTER — Emergency Department (HOSPITAL_COMMUNITY)
Admission: EM | Admit: 2018-11-03 | Discharge: 2018-11-03 | Disposition: A | Discharge status: Home / Self Care | Payer: BC Managed Care – PPO | Attending: Emergency Medicine | Admitting: Emergency Medicine

## 2018-11-03 ENCOUNTER — Encounter (HOSPITAL_COMMUNITY): Payer: Self-pay | Admitting: Emergency Medicine

## 2018-11-03 ENCOUNTER — Emergency Department (HOSPITAL_COMMUNITY): Payer: BC Managed Care – PPO

## 2018-11-03 DIAGNOSIS — Z79899 Other long term (current) drug therapy: Secondary | ICD-10-CM | POA: Insufficient documentation

## 2018-11-03 DIAGNOSIS — R05 Cough: Secondary | ICD-10-CM | POA: Insufficient documentation

## 2018-11-03 DIAGNOSIS — R0789 Other chest pain: Secondary | ICD-10-CM | POA: Diagnosis not present

## 2018-11-03 DIAGNOSIS — I10 Essential (primary) hypertension: Secondary | ICD-10-CM | POA: Diagnosis not present

## 2018-11-03 DIAGNOSIS — R079 Chest pain, unspecified: Secondary | ICD-10-CM

## 2018-11-03 LAB — CBC WITH DIFFERENTIAL/PLATELET
Abs Immature Granulocytes: 0.04 10*3/uL (ref 0.00–0.07)
Basophils Absolute: 0 10*3/uL (ref 0.0–0.1)
Basophils Relative: 0 %
Eosinophils Absolute: 0.1 10*3/uL (ref 0.0–0.5)
Eosinophils Relative: 1 %
HCT: 44.1 % (ref 39.0–52.0)
Hemoglobin: 15.3 g/dL (ref 13.0–17.0)
Immature Granulocytes: 0 %
Lymphocytes Relative: 32 %
Lymphs Abs: 2.8 10*3/uL (ref 0.7–4.0)
MCH: 31 pg (ref 26.0–34.0)
MCHC: 34.7 g/dL (ref 30.0–36.0)
MCV: 89.3 fL (ref 80.0–100.0)
Monocytes Absolute: 0.7 10*3/uL (ref 0.1–1.0)
Monocytes Relative: 8 %
Neutro Abs: 5.2 10*3/uL (ref 1.7–7.7)
Neutrophils Relative %: 59 %
Platelets: 205 10*3/uL (ref 150–400)
RBC: 4.94 MIL/uL (ref 4.22–5.81)
RDW: 12.9 % (ref 11.5–15.5)
WBC: 8.9 10*3/uL (ref 4.0–10.5)
nRBC: 0 % (ref 0.0–0.2)

## 2018-11-03 LAB — COMPREHENSIVE METABOLIC PANEL
ALT: 29 U/L (ref 0–44)
AST: 18 U/L (ref 15–41)
Albumin: 4.3 g/dL (ref 3.5–5.0)
Alkaline Phosphatase: 62 U/L (ref 38–126)
Anion gap: 11 (ref 5–15)
BUN: 10 mg/dL (ref 6–20)
CO2: 26 mmol/L (ref 22–32)
Calcium: 9.1 mg/dL (ref 8.9–10.3)
Chloride: 104 mmol/L (ref 98–111)
Creatinine, Ser: 0.76 mg/dL (ref 0.61–1.24)
GFR calc Af Amer: >60 mL/min (ref >60)
GFR calc non Af Amer: >60 mL/min (ref >60)
Glucose, Bld: 92 mg/dL (ref 70–99)
Potassium: 3.9 mmol/L (ref 3.5–5.1)
Sodium: 141 mmol/L (ref 135–145)
Total Bilirubin: 0.8 mg/dL (ref 0.3–1.2)
Total Protein: 7.2 g/dL (ref 6.5–8.1)

## 2018-11-03 LAB — TROPONIN I (HIGH SENSITIVITY)
Troponin I (High Sensitivity): 3 ng/L (ref <18)
Troponin I (High Sensitivity): 3 ng/L (ref <18)

## 2018-11-03 LAB — D-DIMER, QUANTITATIVE: D-Dimer, Quant: <0.27 ug/mL-FEU (ref 0.00–0.50)

## 2018-11-03 LAB — LIPASE, BLOOD: Lipase: 22 U/L (ref 11–51)

## 2018-11-03 MED ORDER — NAPROXEN 500 MG PO TABS: 500.0000 mg | ORAL_TABLET | Freq: Two times a day (BID) | ORAL | 0 refills | Status: DC

## 2018-11-03 MED ORDER — KETOROLAC TROMETHAMINE 30 MG/ML IJ SOLN
15.0000 mg | Freq: Once | INTRAMUSCULAR | Status: AC
  Administered 2018-11-03: 22:00:00 15 mg via INTRAVENOUS
  Filled 2018-11-03: qty 1

## 2018-11-03 MED ORDER — METHOCARBAMOL 500 MG PO TABS: 500.0000 mg | ORAL_TABLET | Freq: Three times a day (TID) | ORAL | 0 refills | Status: DC | PRN

## 2018-11-03 MED ORDER — ACETAMINOPHEN 325 MG PO TABS
650.0000 mg | ORAL_TABLET | Freq: Once | ORAL | Status: AC
  Administered 2018-11-03: 21:00:00 650 mg via ORAL
  Filled 2018-11-03: qty 2

## 2018-11-03 NOTE — ED Provider Notes (Signed)
[facility] EMERGENCY DEPARTMENT Provider Note   CSN: [id number] Arrival date & time: 11/03/18  1707     History   Chief Complaint Chief Complaint  Patient presents with  . Chest Pain    HPI Zachary Cisneros is a 38 y.o. male with a hx of HTN & GERD who presents to the ED w/ complaints of chest pain that began @ 20:00 last night. Patient states pain is sharp, constant, located in the R chest, radiates around to the back, and is currently a 9/10 in severity. Pain is worse with deep breathing to the point he feels he is breathing more shallowly. Pain also worse with cough & certain positions. He has had minimal cough over past few days which he related to his GERD. Has tried flexeril, heat, & ice without relief. Denies fever, chills, nausea, vomiting, abdominal pain, diaphoresis,  leg pain/swelling, hemoptysis, recent surgery/trauma, recent long travel, hormone use, personal hx of cancer, or hx of DVT/PE. He did some heavy lifting at work this past week but the pain didn't start until last night, no specific injury that he can recall.     HPI  Past Medical History:  Diagnosis Date  . Allergic rhinitis   . Dysphagia   . Family history of adverse reaction to anesthesia    Mother had anesthesia awareness during surgical procedure,apprx.5 yrs ago  . GERD (gastroesophageal reflux disease) 2014   resolved with weight loss  . History of hiatal hernia 2014   small ,noted in endoscopy report  . Phimosis 07/2016    Patient Active Problem List   Diagnosis Date Noted  . GERD (gastroesophageal reflux disease) 10/30/2012  . Fatty liver 10/30/2012    Past Surgical History:  Procedure Laterality Date  . CIRCUMCISION N/A 08/22/2016   Procedure: CIRCUMCISION ADULT;  Surgeon: Alexis Frock, MD;  Location: [facility];  Service: Urology;  Laterality: N/A;  . [place] Medications    Prior to Admission medications   Medication Sig Start Date  End Date Taking? Authorizing Provider  ascorbic acid (VITAMIN C) 250 MG CHEW Chew 250 mg by mouth daily.    [provider]  azithromycin (ZITHROMAX Z-PAK) 250 MG tablet Take 2 tablets on day one, then 1 tablet daily on days 2 through 5 02/24/18   Jacqulyn Cane, MD  benzonatate (TESSALON) 200 MG capsule Take 1 every 8 hours as needed for cough. 02/24/18   Jacqulyn Cane, MD  losartan (COZAAR) 25 MG tablet Take 25 mg by mouth daily.    [provider]    Family History Family History  Problem Relation Age of Onset  . Hypertension Mother   . Hypertension Father   . Lung disease Father     Social History Social History   Tobacco Use  . Smoking status: Never Smoker  . Smokeless tobacco: Never Used  Substance Use Topics  . Alcohol use: No  . Drug use: No     Allergies   Codeine and Sulfa antibiotics   Review of Systems Review of Systems  Constitutional: Negative for chills, diaphoresis and fever.  HENT: Negative for congestion, ear pain and sore throat.   Respiratory: Positive for cough. Negative for shortness of breath.   Cardiovascular: Positive for chest pain. Negative for palpitations and leg swelling.  Gastrointestinal: Negative for abdominal pain, nausea and vomiting.  Neurological: Negative for syncope, weakness and numbness.  All other systems reviewed and are  negative.    Physical Exam Updated Vital Signs BP 129/87 (BP Location: Right Arm)   Pulse 79   Temp 98.5 F (36.9 C) (Oral)   Resp 18   Ht 5\' 8"  (1.727 m)   Wt 115.2 kg   SpO2 95%   BMI 38.62 kg/m   Physical Exam Vitals signs and nursing note reviewed.  Constitutional:      General: He is not in acute distress.    Appearance: He is well-developed. He is not toxic-appearing.  HENT:     Head: Normocephalic and atraumatic.  Eyes:     General:        Right eye: No discharge.        Left eye: No discharge.     Conjunctiva/sclera: Conjunctivae normal.  Neck:     Musculoskeletal:  Neck supple.  Cardiovascular:     Rate and Rhythm: Normal rate and regular rhythm.     Pulses:          Radial pulses are 2+ on the right side and 2+ on the left side.  Pulmonary:     Effort: Pulmonary effort is normal. No respiratory distress.     Breath sounds: Normal breath sounds. No wheezing, rhonchi or rales.  Chest:     Chest wall: No tenderness.     Comments: No rashes Abdominal:     General: There is no distension.     Palpations: Abdomen is soft.     Tenderness: There is no abdominal tenderness. There is no guarding or rebound. Negative signs include Murphy's sign.  Musculoskeletal:     Right lower leg: He exhibits no tenderness. No edema.     Left lower leg: He exhibits no tenderness. No edema.     Comments: UEs: Intact AROM, no point/focal bony tenderness.   Skin:    General: Skin is warm and dry.     Findings: No rash.  Neurological:     Mental Status: He is alert.     Comments: Clear speech.   Psychiatric:        Behavior: Behavior normal.    ED Treatments / Results  Labs (all labs ordered are listed, but only abnormal results are displayed) Labs Reviewed  LIPASE, BLOOD  CBC WITH DIFFERENTIAL/PLATELET  COMPREHENSIVE METABOLIC PANEL  D-DIMER, QUANTITATIVE (NOT AT [facility])  TROPONIN I (HIGH SENSITIVITY)  TROPONIN I (HIGH SENSITIVITY)    EKG EKG Interpretation  Date/Time:  Monday November 03 2018 18:50:46 EDT Ventricular Rate:  73 PR Interval:  156 QRS Duration: 76 QT Interval:  368 QTC Calculation: 405 R Axis:   11 Text Interpretation:  Normal sinus rhythm Low voltage QRS Cannot rule out Anterior infarct , age undetermined Abnormal ECG Confirmed by Virgel Manifold [phone]) on 11/03/2018 7:57:18 PM   Radiology Dg Chest 2 View  Result Date: 11/03/2018 CLINICAL DATA:  PT c/o right sided chest pain radiates into right shoulder and into upper back x1 day. Hx GERD EXAM: CHEST - 2 VIEW COMPARISON:  None. FINDINGS: The heart size and mediastinal contours are  within normal limits. The lungs are clear. No pneumothorax or pleural effusion. No acute finding in the visualized skeleton. The visualized skeletal structures are unremarkable. IMPRESSION: No active cardiopulmonary disease. Electronically Signed   By: Audie Pinto M.D.   On: 11/03/2018 19:20    Procedures Procedures (including critical care time)  Medications Ordered in ED Medications - No data to display   Initial Impression / Assessment and Plan / ED Course  I  have reviewed the triage vital signs and the nursing notes.  Pertinent labs & imaging results that were available during my care of the patient were reviewed by me and considered in my medical decision making (see chart for details).    Patient presents to the emergency department with chest pain. Patient nontoxic appearing, in no apparent distress, vitals without significant abnormality. Fairly benign physical exam. DDX: ACS, pulmonary embolism, dissection, pneumothorax, effusion, infiltrate, arrhythmia, anemia, electrolyte derangement, MSK, GERD, anxiety, cholelithiasis, pancreatitis. Evaluation initiated with labs, EKG, and CXR. Patient on cardiac monitor.   Work-up in the ER reviewed:  CBC: No anemia or leukocytosis.  CMP: WNL, no significant electrolyte derangement, LFTs WNL Lipase: WNL D-dimer: WNL Troponin: Initial- 3 EKG: no STEMI CXR:  Negative, without infiltrate, effusion, pneumothorax, or fracture/dislocation.   Low risk wells, d-dimer WNL, doubt PE.  No widened mediastinum on imaging, symmetric pulses, doubt dissection.  Abdomen w/o peritoneal signs, LFTs/lipase/WBC WNL- doubt cholecystitis/pancreatitis.  Labs/imaging overall reassuring.  EKG: No STEMI, initial troponin 3, low risk heart pathway score- delta troponin pending.   21:45: RE-EVAL: Patient feeling improved, updated on results & plan of care thus far, provided opportunity for questions, patient confirmed understanding & is in agreement.   22:00:  Patient care signed out to Evalee Jefferson PA-C at change of shift pending repeat troponin, if no significant change/elevation anticipate discharge home with naproxen/robaxin for possible MSK cause- definitive etiology unclear. PCP follow up.   Final Clinical Impressions(s) / ED Diagnoses   Final diagnoses:  Chest pain, unspecified type    ED Discharge Orders         Ordered    naproxen (NAPROSYN) 500 MG tablet  2 times daily     11/03/18 2127    methocarbamol (ROBAXIN) 500 MG tablet  Every 8 hours PRN     11/03/18 2127           Amaryllis Dyke, PA-C 11/03/18 2201    Virgel Manifold, MD 11/09/18 1235

## 2018-11-03 NOTE — Discharge Instructions (Signed)
You were seen in the emergency department today for chest pain. Your work-up in the emergency department has been overall reassuring. Your labs have been fairly normal and or similar to previous blood work you have had done. Your EKG and the enzyme we use to check your heart did not show an acute heart attack at this time. Your chest x-ray was normal.   We are sending you home with the following medicines:  - Naproxen is a nonsteroidal anti-inflammatory medication that will help with pain and swelling. Be sure to take this medication as prescribed with food, 1 pill every 12 hours,  It should be taken with food, as it can cause stomach upset, and more seriously, stomach bleeding. Do not take other nonsteroidal anti-inflammatory medications with this such as Advil, Motrin, Aleve, Mobic, Goodie Powder, or Motrin.    - Robaxin is the muscle relaxer I have prescribed, this is meant to help with muscle tightness. Be aware that this medication may make you drowsy therefore the first time you take this it should be at a time you are in an environment where you can rest. Do not drive or operate heavy machinery when taking this medication. Do not drink alcohol or take other sedating medications with this medicine such as narcotics or benzodiazepines.   You make take Tylenol per over the counter dosing with these medications.   We have prescribed you new medication(s) today. Discuss the medications prescribed today with your pharmacist as they can have adverse effects and interactions with your other medicines including over the counter and prescribed medications. Seek medical evaluation if you start to experience new or abnormal symptoms after taking one of these medicines, seek care immediately if you start to experience difficulty breathing, feeling of your throat closing, facial swelling, or rash as these could be indications of a more serious allergic reaction  We would like you to follow up closely with your  primary care provider within 1-3 days. Return to the ER immediately should you experience any new or worsening symptoms including but not limited to return of pain, worsened pain, vomiting, shortness of breath, dizziness, lightheadedness, passing out, or any other concerns that you may have.

## 2018-11-03 NOTE — ED Triage Notes (Signed)
PT c/o right sided chest pain radiates into right shoulder and into upper back x1 day. PT denies any worsening in pain with ROM. PT states he did eat [place] yesterday and is concerned about his gallbladder as well.

## 2019-05-01 ENCOUNTER — Ambulatory Visit: Payer: BC Managed Care – PPO | Attending: Internal Medicine

## 2019-05-01 DIAGNOSIS — Z23 Encounter for immunization: Secondary | ICD-10-CM

## 2019-05-01 NOTE — Progress Notes (Signed)
   Covid-19 Vaccination Clinic  Name:  Zachary Cisneros    MRN: [id number] DOB: [birth year]  05/01/2019  [name] was observed post Covid-19 immunization for 15 minutes without incident. He was provided with Vaccine Information Sheet and instruction to access the V-Safe system.   [name] was instructed to call 911 with any severe reactions post vaccine: Marland Kitchen Difficulty breathing  . Swelling of face and throat  . A fast heartbeat  . A bad rash all over body  . Dizziness and weakness

## 2019-05-27 ENCOUNTER — Ambulatory Visit: Payer: BC Managed Care – PPO | Attending: Internal Medicine

## 2019-05-27 DIAGNOSIS — Z23 Encounter for immunization: Secondary | ICD-10-CM

## 2019-05-27 NOTE — Progress Notes (Signed)
   Covid-19 Vaccination Clinic  Name:  Zachary Cisneros    MRN: [id number] DOB: 1981-01-29  05/27/2019  Mr. Zachary Cisneros was observed post Covid-19 immunization for 15 minutes without incident. He was provided with Vaccine Information Sheet and instruction to access the V-Safe system.   Mr. Zachary Cisneros was instructed to call 911 with any severe reactions post vaccine: Marland Kitchen Difficulty breathing  . Swelling of face and throat  . A fast heartbeat  . A bad rash all over body  . Dizziness and weakness   Immunizations Administered    Name Date Dose VIS Date Route   Pfizer COVID-19 Vaccine 05/27/2019  1:58 PM 0.3 mL 02/06/2019 Intramuscular   Manufacturer: [employer], [employer]   Lot: [id number]   [place]: [id number]

## 2021-06-27 IMAGING — DX DG CHEST 2V
2 series · 2 of 2 positions shown · non-contrast
Comparison: None.

CLINICAL DATA: PT c/o right sided chest pain radiates into right
shoulder and into upper back x1 day. Hx GERD

EXAM:
CHEST - 2 VIEW

[chest pa]
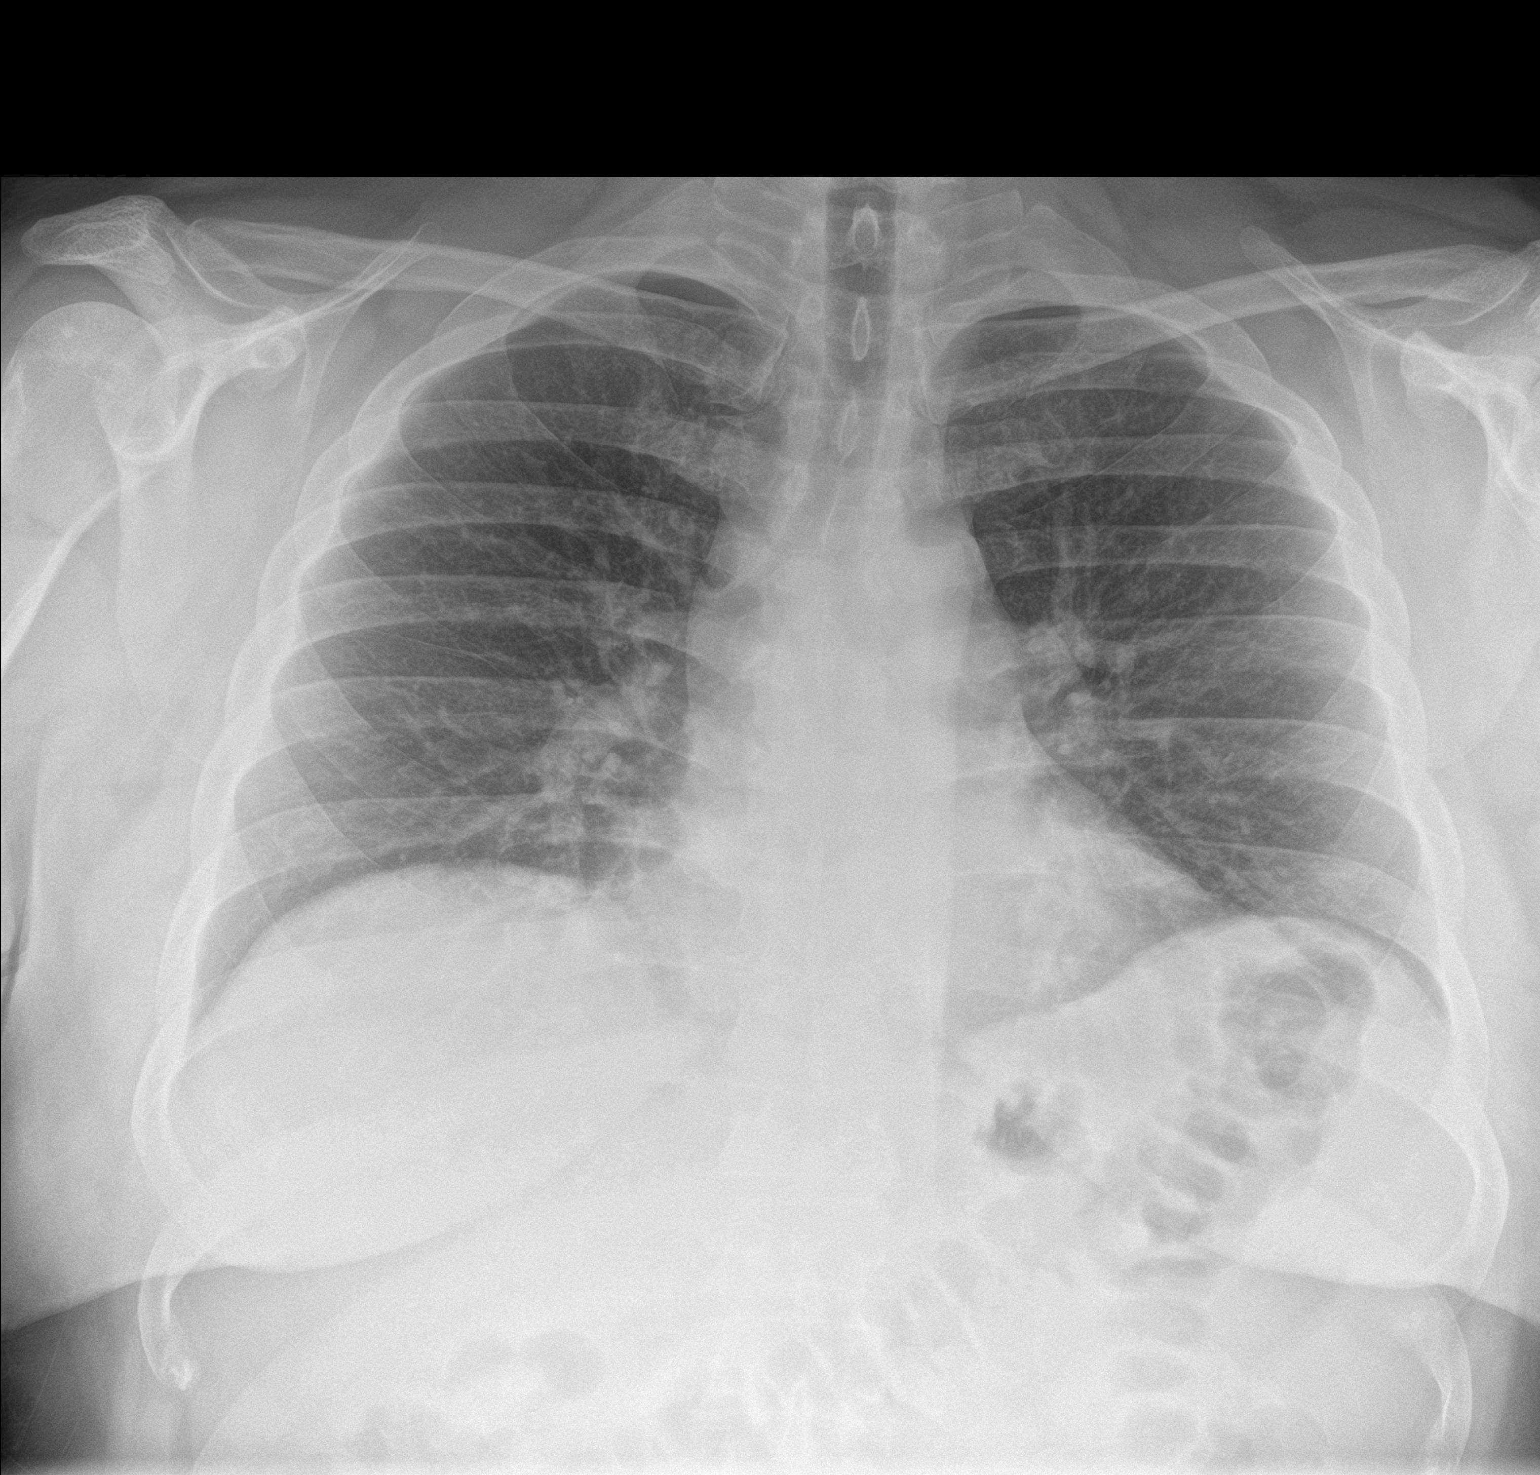

[chest lat]
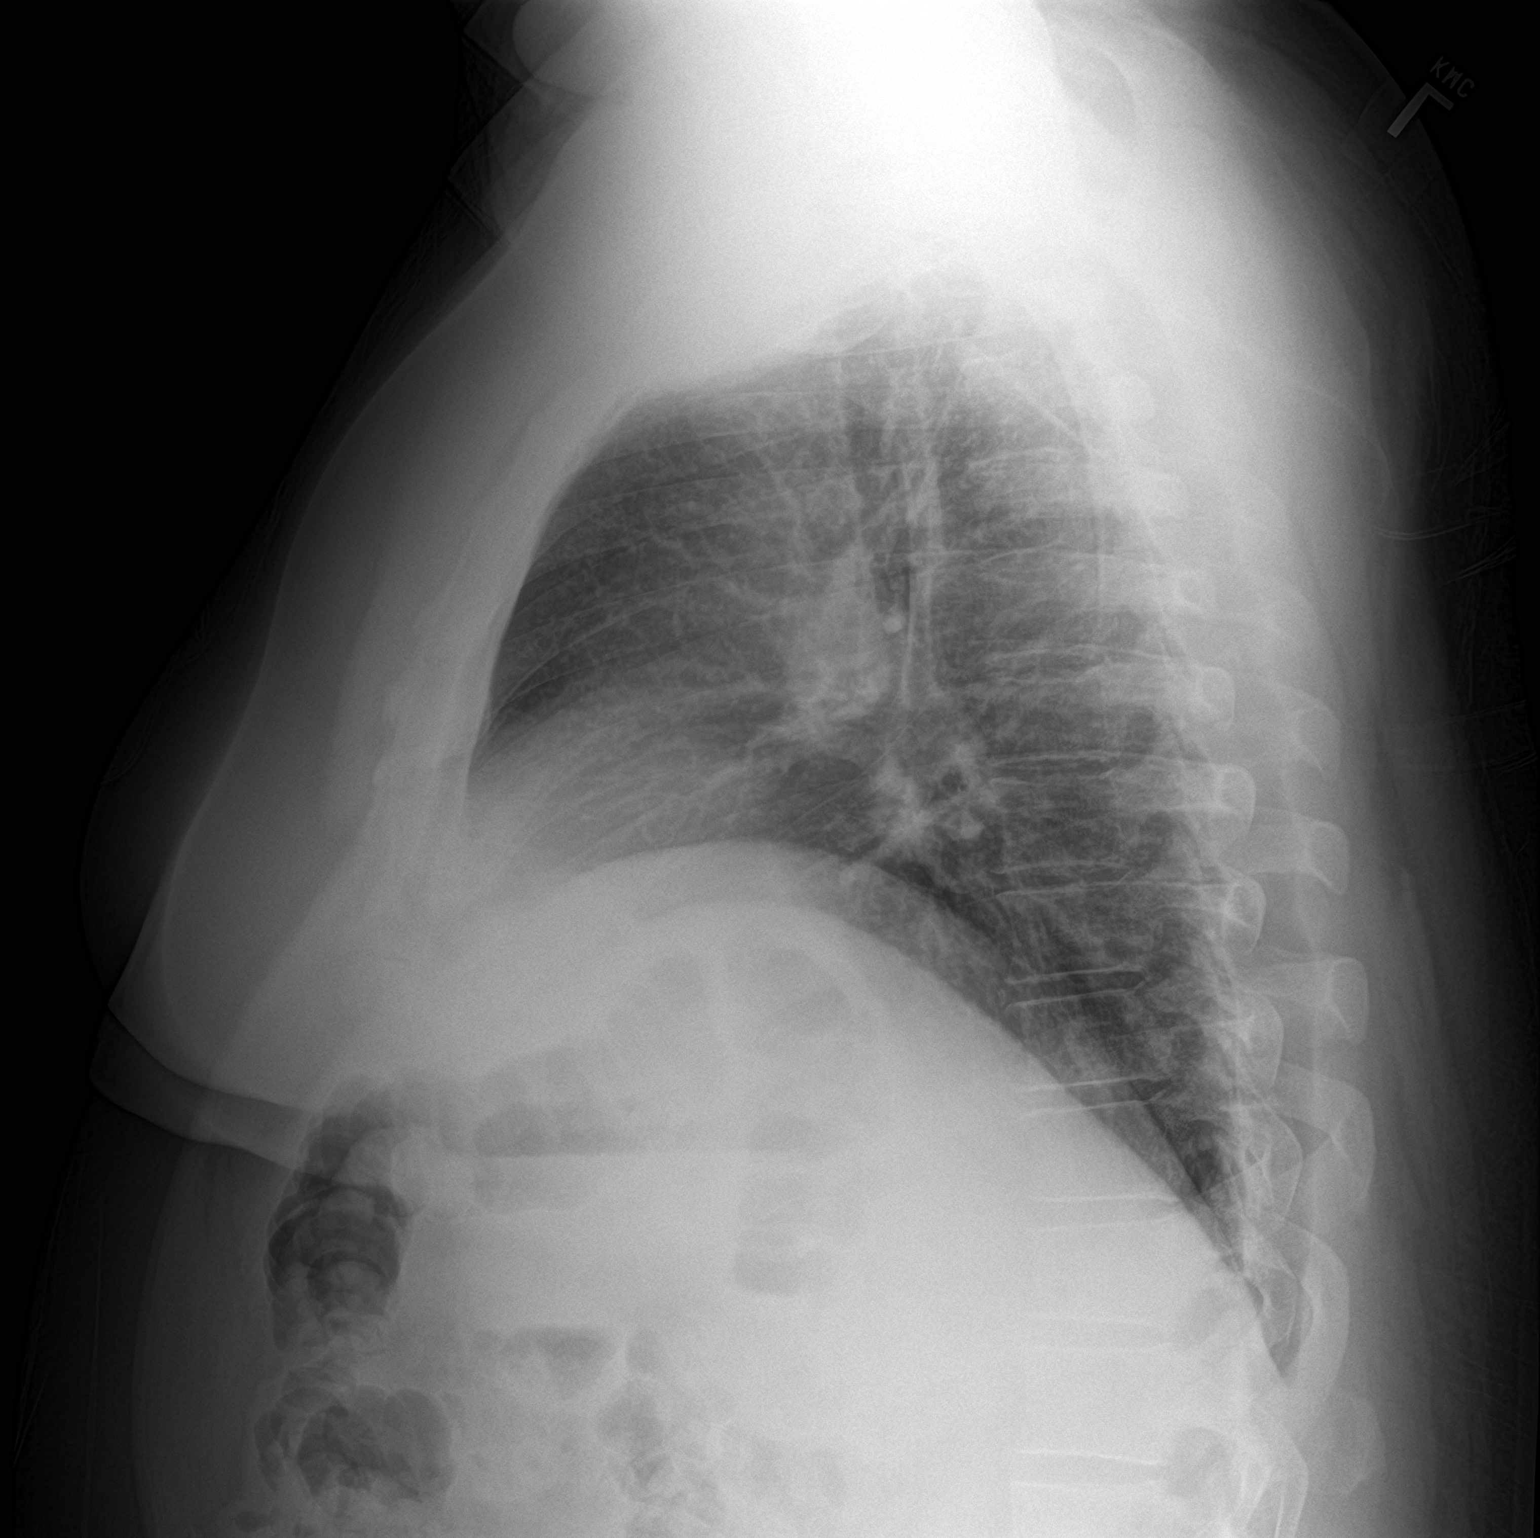

[2 of 2 positions shown; findings below may reference images not displayed]

FINDINGS: The heart size and mediastinal contours are within normal limits.
The lungs are clear. No pneumothorax or pleural effusion. No acute
finding in the visualized skeleton. The visualized skeletal
structures are unremarkable.
IMPRESSION: No active cardiopulmonary disease.

## 2022-01-06 ENCOUNTER — Ambulatory Visit
Admission: RE | Admit: 2022-01-06 | Discharge: 2022-01-06 | Disposition: A | Discharge status: Home / Self Care | Payer: BC Managed Care – PPO | Source: Ambulatory Visit | Attending: Nurse Practitioner | Admitting: Nurse Practitioner

## 2022-01-06 ENCOUNTER — Ambulatory Visit (INDEPENDENT_AMBULATORY_CARE_PROVIDER_SITE_OTHER): Payer: BC Managed Care – PPO

## 2022-01-06 ENCOUNTER — Other Ambulatory Visit: Payer: Self-pay

## 2022-01-06 VITALS — BP 147/93 | HR 76 | Temp 99.0°F | Resp 16

## 2022-01-06 DIAGNOSIS — S29011A Strain of muscle and tendon of front wall of thorax, initial encounter: Secondary | ICD-10-CM

## 2022-01-06 DIAGNOSIS — R079 Chest pain, unspecified: Secondary | ICD-10-CM

## 2022-01-06 MED ORDER — IBUPROFEN 800 MG PO TABS: 800.0000 mg | ORAL_TABLET | Freq: Three times a day (TID) | ORAL | 0 refills | Status: AC | PRN

## 2022-01-06 MED ORDER — METHOCARBAMOL 500 MG PO TABS: 500.0000 mg | ORAL_TABLET | Freq: Two times a day (BID) | ORAL | 0 refills | Status: DC

## 2022-01-06 NOTE — ED Triage Notes (Signed)
Pt states pain and swelling to left chest under his arm. States pain is worse when he uses his left arm and when he turns his neck. States he may have strained it at work. Pt states he has been using ice and is getting relief from it.

## 2022-01-06 NOTE — Discharge Instructions (Addendum)
The chest x-ray showed some abnormal findings of the heart, EKG was normal. Recommend follow-up with your primary care physician or cardiology within the next 5 to 7 days for reevaluation. Take medication as prescribed. Continue the use of ice as needed.  Ice will help with pain or swelling, heat for spasm or stiffness.  Apply for 20 minutes, remove for 1 hour, then repeat is much as possible. Gentle stretching and range of motion exercises to help with pain and tenderness. If your symptoms do not improve over the next 7 to 10 days, recommend following up with your primary care physician for further evaluation. Go to the emergency department immediately if you experience shortness of breath, difficulty breathing, pain that you cannot control with medications prescribed today, or other concerns.

## 2022-01-06 NOTE — ED Provider Notes (Signed)
[facility]    CSN: [id number] Arrival date & time: 01/06/22  6010      History   Chief Complaint Chief Complaint  Patient presents with   Chest Injury    Strained chest muscle or possible tear. Hurting since Wednesday night and not getting better with rest and Advil. - Entered by patient    HPI Zachary Cisneros is a 41 y.o. male.   The history is provided by the patient.   The patient presents with a 2-day history of pain to his left chest and left shoulder.  Pain is located under the left axilla.  He states movement such as raising the arm, lifting the arm, and extending the arm makes the pain worse.  He also states when he has to turn his head completely to the right, he feels pain in the left shoulder.  He also states that the pain is "sharp" in character.  He states when he puts pressure under the left arm, this makes it easier for him to extend the arm outward.  He states that he thinks that he may have injured the arm at work because he was doing a lot of above the head reaching and working with his computer at a more elevated level.  Patient also states that during the night around the same time his symptoms started he picked up a 20 pound weighted blanket and throat on the floor with the left arm.  Patient states he has been using ice which makes his symptoms feel better.  He has also been using Advil for his symptoms.  He denies shortness of breath, difficulty breathing, but states that the pain does radiate down into the left arm.  Denies any previous cardiac history.  Past Medical History:  Diagnosis Date   Allergic rhinitis    Dysphagia    Family history of adverse reaction to anesthesia    Mother had anesthesia awareness during surgical procedure,apprx.5 yrs ago   GERD (gastroesophageal reflux disease) 2014   resolved with weight loss   History of hiatal hernia 2014   small ,noted in endoscopy report   Phimosis 07/2016    Patient Active Problem List    Diagnosis Date Noted   GERD (gastroesophageal reflux disease) 10/30/2012   Fatty liver 10/30/2012    Past Surgical History:  Procedure Laterality Date   CIRCUMCISION N/A 08/22/2016   Procedure: CIRCUMCISION ADULT;  Surgeon: Alexis Frock, MD;  Location: [facility];  Service: Urology;  Laterality: N/A;   [place] Medications    Prior to Admission medications   Medication Sig Start Date End Date Taking? Authorizing Provider  ibuprofen (ADVIL) 800 MG tablet Take 1 tablet (800 mg total) by mouth every 8 (eight) hours as needed. 01/06/22  Yes Neliah Cuyler-Warren, Alda Lea, NP  methocarbamol (ROBAXIN) 500 MG tablet Take 1 tablet (500 mg total) by mouth 2 (two) times daily. 01/06/22  Yes Laraine Samet-Warren, Alda Lea, NP  hydrochlorothiazide (HYDRODIURIL) 12.5 MG tablet Take 12.5 mg by mouth daily.  08/21/18   [provider]  moexipril (UNIVASC) 15 MG tablet Take 30 mg by mouth every morning. 08/11/18   [provider]  naproxen (NAPROSYN) 500 MG tablet Take 1 tablet (500 mg total) by mouth 2 (two) times daily. 11/03/18   Petrucelli, Glynda Jaeger, PA-C    Family History Family History  Problem Relation Age of Onset   Hypertension Mother    Hypertension Father  Lung disease Father     Social History Social History   Tobacco Use   Smoking status: Never   Smokeless tobacco: Never  Vaping Use   Vaping Use: Never used  Substance Use Topics   Alcohol use: No   Drug use: No     Allergies   Codeine and Sulfa antibiotics   Review of Systems Review of Systems Per HPI wound issues  Physical Exam Triage Vital Signs ED Triage Vitals [01/06/22 0901]  Enc Vitals Group     BP (!) 147/93     Pulse Rate 76     Resp 16     Temp 99 F (37.2 C)     Temp Source Oral     SpO2 95 %     Weight      Height      Head Circumference      Peak Flow      Pain Score      Pain Loc      Pain Edu?      Excl. in [place]?    No data  found.  Updated Vital Signs BP (!) 147/93 (BP Location: Right Arm)   Pulse 76   Temp 99 F (37.2 C) (Oral)   Resp 16   SpO2 95%   Visual Acuity Right Eye Distance:   Left Eye Distance:   Bilateral Distance:    Right Eye Near:   Left Eye Near:    Bilateral Near:     Physical Exam Vitals and nursing note reviewed.  Constitutional:      General: He is not in acute distress.    Appearance: Normal appearance.  HENT:     Head: Normocephalic.  Cardiovascular:     Rate and Rhythm: Normal rate and regular rhythm.  Pulmonary:     Effort: Pulmonary effort is normal. No respiratory distress.     Breath sounds: Normal breath sounds. No stridor. No wheezing, rhonchi or rales.  Chest:     Chest wall: Tenderness (left chest wall) present. No deformity, swelling or edema.  Abdominal:     General: Bowel sounds are normal.     Palpations: Abdomen is soft.  Musculoskeletal:     Cervical back: Normal range of motion.  Skin:    General: Skin is warm and dry.  Neurological:     General: No focal deficit present.     Mental Status: He is alert and oriented to person, place, and time.  Psychiatric:        Mood and Affect: Mood normal.        Behavior: Behavior normal.      UC Treatments / Results  Labs (all labs ordered are listed, but only abnormal results are displayed) Labs Reviewed - No data to display  EKG: Normal sinus rhythm, no STEMI   Radiology DG Chest 2 View  Result Date: 01/06/2022 CLINICAL DATA:  Left chest pain for 2 days EXAM: CHEST - 2 VIEW COMPARISON:  11/03/2018 FINDINGS: Mild enlargement of the cardiopericardial silhouette, without edema. No blunting of the costophrenic angles. No airspace opacity is identified. The lungs appear clear. No significant bony findings. IMPRESSION: 1. Mild enlargement of the cardiopericardial silhouette, without edema. Electronically Signed   By: Van Clines M.D.   On: 01/06/2022 09:51    Procedures Procedures (including  critical care time)  Medications Ordered in UC Medications - No data to display  Initial Impression / Assessment and Plan / UC Course  I have reviewed the triage  vital signs and the nursing notes.  Pertinent labs & imaging results that were available during my care of the patient were reviewed by me and considered in my medical decision making (see chart for details).  Patient presents for complaints of pain to the left chest wall under the left axilla that have been present for the past 2 days.  On exam, symptoms appear muscular at this time as pain is impacted by movement.  EKG was negative, the chest x-ray shows mild enlargement of the cardiopericardial silhouette.  Patient was advised that he should follow-up with his primary care physician within the next 5 to 7 days for reevaluation of this finding.  Symptoms are consistent with a left chest wall strain.  Will avoid use of steroids for inflammation per the patient's request due to previous adverse reaction to the medication.  We will treat patient with ibuprofen 800 mg.  Also discussed the use of continuing ice or heat.  For spasm, patient was prescribed methocarbamol 500 mg. Supportive care recommendations were provided to the patient along with strict follow-up precautions.  Patient verbalizes understanding.  All questions were answered.  Patient is stable for discharge. Final Clinical Impressions(s) / UC Diagnoses   Final diagnoses:  Chest wall muscle strain, initial encounter     Discharge Instructions      The chest x-ray showed some abnormal findings of the heart, EKG was normal. Recommend follow-up with your primary care physician or cardiology within the next 5 to 7 days for reevaluation. Take medication as prescribed. Continue the use of ice as needed.  Ice will help with pain or swelling, heat for spasm or stiffness.  Apply for 20 minutes, remove for 1 hour, then repeat is much as possible. Gentle stretching and range of  motion exercises to help with pain and tenderness. If your symptoms do not improve over the next 7 to 10 days, recommend following up with your primary care physician for further evaluation. Go to the emergency department immediately if you experience shortness of breath, difficulty breathing, pain that you cannot control with medications prescribed today, or other concerns.     ED Prescriptions     Medication Sig Dispense Auth. Provider   ibuprofen (ADVIL) 800 MG tablet Take 1 tablet (800 mg total) by mouth every 8 (eight) hours as needed. 30 tablet Grason Brailsford-Warren, Alda Lea, NP   methocarbamol (ROBAXIN) 500 MG tablet Take 1 tablet (500 mg total) by mouth 2 (two) times daily. 20 tablet Dudley Mages-Warren, Alda Lea, NP      PDMP not reviewed this encounter.   Tish Men, NP 01/06/22 1007

## 2022-05-28 ENCOUNTER — Other Ambulatory Visit (HOSPITAL_COMMUNITY): Payer: Self-pay | Admitting: Family Medicine

## 2022-05-28 ENCOUNTER — Ambulatory Visit (HOSPITAL_COMMUNITY)
Admission: RE | Admit: 2022-05-28 | Discharge: 2022-05-28 | Disposition: A | Discharge status: Home / Self Care | Payer: BC Managed Care – PPO | Source: Ambulatory Visit | Attending: Family Medicine | Admitting: Family Medicine

## 2022-05-28 DIAGNOSIS — R0789 Other chest pain: Secondary | ICD-10-CM

## 2023-05-01 ENCOUNTER — Ambulatory Visit
Admission: RE | Admit: 2023-05-01 | Discharge: 2023-05-01 | Disposition: A | Discharge status: Home / Self Care | Source: Ambulatory Visit

## 2023-05-01 VITALS — BP 122/70 | HR 105 | Temp 99.3°F | Resp 18

## 2023-05-01 DIAGNOSIS — J0140 Acute pansinusitis, unspecified: Secondary | ICD-10-CM

## 2023-05-01 HISTORY — DX: Essential (primary) hypertension: I10

## 2023-05-01 LAB — POC COVID19/FLU A&B COMBO
Covid Antigen, POC: NEGATIVE
Influenza A Antigen, POC: NEGATIVE
Influenza B Antigen, POC: NEGATIVE

## 2023-05-01 MED ORDER — PSEUDOEPH-BROMPHEN-DM 30-2-10 MG/5ML PO SYRP: 5.0000 mL | ORAL_SOLUTION | Freq: Four times a day (QID) | ORAL | 0 refills | Status: AC | PRN

## 2023-05-01 MED ORDER — FLUTICASONE PROPIONATE 50 MCG/ACT NA SUSP: 2.0000 | Freq: Every day | NASAL | 0 refills | Status: AC

## 2023-05-01 MED ORDER — AMOXICILLIN-POT CLAVULANATE 875-125 MG PO TABS: 1.0000 | ORAL_TABLET | Freq: Two times a day (BID) | ORAL | 0 refills | Status: AC

## 2023-05-01 NOTE — ED Provider Notes (Signed)
 [facility]    CSN: [id number] Arrival date & time: 05/01/23  1309      History   Chief Complaint Chief Complaint  Patient presents with   Influenza    Entered by patient    HPI Zachary Cisneros is a 43 y.o. male.   The history is provided by the patient.   Patient presents with a 3-day history of headache, nasal congestion, fever, and cough.  Patient states that the right side of his face hurts and that the right eye has been draining.  He also complains of pain in the right ear.  Tmax 101.3.  Denies sore throat, wheezing, difficulty breathing, chest pain, abdominal pain, nausea, vomiting, diarrhea, or rash.  Patient states he has been taking ibuprofen and using NyQuil on occasion for his symptoms.  Past Medical History:  Diagnosis Date   Allergic rhinitis    Dysphagia    Family history of adverse reaction to anesthesia    Mother had anesthesia awareness during surgical procedure,apprx.5 yrs ago   GERD (gastroesophageal reflux disease) 2014   resolved with weight loss   History of hiatal hernia 2014   small ,noted in endoscopy report   Hypertension    Phimosis 07/2016    Patient Active Problem List   Diagnosis Date Noted   GERD (gastroesophageal reflux disease) 10/30/2012   Fatty liver 10/30/2012    Past Surgical History:  Procedure Laterality Date   CIRCUMCISION N/A 08/22/2016   Procedure: CIRCUMCISION ADULT;  Surgeon: Sebastian Ache, MD;  Location: [facility];  Service: Urology;  Laterality: N/A;   WISDOM TOOTH EXTRACTION  1999       Home Medications    Prior to Admission medications   Medication Sig Start Date End Date Taking? Authorizing Provider  amLODipine (NORVASC) 2.5 MG tablet Take 2.5 mg by mouth daily.   Yes [provider]  olmesartan (BENICAR) 5 MG tablet Take 5 mg by mouth daily.   Yes [provider]  ibuprofen (ADVIL) 800 MG tablet Take 1 tablet (800 mg total) by mouth every 8 (eight) hours as  needed. 01/06/22   Leath-Warren, Sadie Haber, NP    Family History Family History  Problem Relation Age of Onset   Hypertension Mother    Hypertension Father    Lung disease Father     Social History Social History   Tobacco Use   Smoking status: Never   Smokeless tobacco: Never  Vaping Use   Vaping status: Never Used  Substance Use Topics   Alcohol use: No   Drug use: No     Allergies   Codeine and Sulfa antibiotics   Review of Systems Review of Systems Per HPI  Physical Exam Triage Vital Signs ED Triage Vitals  Encounter Vitals Group     BP 05/01/23 1337 122/70     Systolic BP Percentile --      Diastolic BP Percentile --      Pulse Rate 05/01/23 1337 (!) 105     Resp 05/01/23 1337 18     Temp 05/01/23 1337 99.3 F (37.4 C)     Temp Source 05/01/23 1337 Oral     SpO2 05/01/23 1337 93 %     Weight --      Height --      Head Circumference --      Peak Flow --      Pain Score 05/01/23 1338 8     Pain Loc --  Pain Education --      Exclude from Growth Chart --    No data found.  Updated Vital Signs BP 122/70 (BP Location: Right Arm)   Pulse (!) 105   Temp 99.3 F (37.4 C) (Oral)   Resp 18   SpO2 93%   Visual Acuity Right Eye Distance:   Left Eye Distance:   Bilateral Distance:    Right Eye Near:   Left Eye Near:    Bilateral Near:     Physical Exam Vitals and nursing note reviewed.  Constitutional:      General: He is not in acute distress.    Appearance: Normal appearance.  HENT:     Head: Normocephalic.     Right Ear: Ear canal and external ear normal. A middle ear effusion is present.     Left Ear: Tympanic membrane, ear canal and external ear normal.     Nose: Nasal tenderness (right face) and congestion present.     Right Turbinates: Enlarged and swollen.     Left Turbinates: Enlarged and swollen.     Right Sinus: Maxillary sinus tenderness and frontal sinus tenderness present.     Mouth/Throat:     Lips: Pink.     Mouth:  Mucous membranes are moist.     Pharynx: Uvula midline. Postnasal drip present. No pharyngeal swelling, oropharyngeal exudate, posterior oropharyngeal erythema or uvula swelling.     Comments: Cobblestoning present to posterior oropharynx  Eyes:     General: Lids are normal. Vision grossly intact. No allergic shiner, visual field deficit or scleral icterus.       Right eye: Discharge ("yellow") present. No foreign body or hordeolum.     Extraocular Movements: Extraocular movements intact.     Right eye: Normal extraocular motion and no nystagmus.     Conjunctiva/sclera:     Right eye: Right conjunctiva is not injected. No chemosis, exudate or hemorrhage.    Pupils: Pupils are equal, round, and reactive to light.  Cardiovascular:     Rate and Rhythm: Tachycardia present.     Pulses: Normal pulses.     Heart sounds: Normal heart sounds.  Pulmonary:     Effort: Pulmonary effort is normal. No respiratory distress.     Breath sounds: Normal breath sounds. No stridor. No wheezing, rhonchi or rales.  Abdominal:     General: Bowel sounds are normal.     Palpations: Abdomen is soft.     Tenderness: There is no abdominal tenderness.  Musculoskeletal:     Cervical back: Normal range of motion.  Skin:    General: Skin is warm and dry.  Neurological:     General: No focal deficit present.     Mental Status: He is alert and oriented to person, place, and time.  Psychiatric:        Mood and Affect: Mood normal.        Behavior: Behavior normal.      UC Treatments / Results  Labs (all labs ordered are listed, but only abnormal results are displayed) Labs Reviewed  POC COVID19/FLU A&B COMBO    EKG   Radiology No results found.  Procedures Procedures (including critical care time)  Medications Ordered in UC Medications - No data to display  Initial Impression / Assessment and Plan / UC Course  I have reviewed the triage vital signs and the nursing notes.  Pertinent labs &  imaging results that were available during my care of the patient were reviewed by me  and considered in my medical decision making (see chart for details).  COVID/flu test was negative.  On exam, lung sounds are clear throughout, room air sats at 93%.  Moderate tenderness to the right maxillary and frontal sinuses.  Right eye with yellow drainage present.  Given patient's current symptoms, symptoms are consistent with acute pansinusitis.  Will treat with Augmentin 875/2025 mg tablets twice daily for the next 7 days, fluticasone 50 micro nasal spray for nasal congestion and sinus tenderness, and Bromfed-DM for the cough.  Supportive care recommendations were provided and discussed with the patient to include fluids, rest, over-the-counter analgesics, and normal saline nasal spray.  Discussed indications with patient regarding follow-up.  Patient was in agreement with this plan of care and verbalized understanding.  All questions were answered.  Patient stable for discharge.  Final Clinical Impressions(s) / UC Diagnoses   Final diagnoses:  None   Discharge Instructions   None    ED Prescriptions   None    PDMP not reviewed this encounter.   Abran Cantor, NP 05/01/23 1416

## 2023-05-01 NOTE — ED Triage Notes (Signed)
 Headache, nasal congestion, fever since Sunday.  Productive cough since Monday.  States face hurts.  States right eye has been draining.  States right ear hurts.

## 2023-05-01 NOTE — Discharge Instructions (Addendum)
 Take medication as directed. Increase fluids and get plenty of rest. May take over-the-counter ibuprofen or Tylenol as needed for pain, fever, or general discomfort. Recommend normal saline nasal spray to help with nasal congestion throughout the day. For your cough, recommend using a humidifier in your bedroom and sleeping elevated on pillows while symptoms persist. If symptoms fail to improve or appear to be worsening, you may follow-up in this clinic or with your PCP for further evaluation. Follow-up as needed.
